# Patient Record
Sex: Female | Born: 1995 | Hispanic: Yes | Marital: Single | Smoking: Current some day smoker
Health system: Southern US, Community
[De-identification: ages and names within clinical notes are randomized; demographics above are authoritative.]

## PROBLEM LIST (undated history)

## (undated) ENCOUNTER — Inpatient Hospital Stay (HOSPITAL_COMMUNITY): Payer: Self-pay

## (undated) DIAGNOSIS — Z789 Other specified health status: Secondary | ICD-10-CM

## (undated) DIAGNOSIS — O24419 Gestational diabetes mellitus in pregnancy, unspecified control: Secondary | ICD-10-CM

## (undated) DIAGNOSIS — N83209 Unspecified ovarian cyst, unspecified side: Secondary | ICD-10-CM

## (undated) DIAGNOSIS — N390 Urinary tract infection, site not specified: Secondary | ICD-10-CM

## (undated) HISTORY — DX: Urinary tract infection, site not specified: N39.0

## (undated) HISTORY — DX: Gestational diabetes mellitus in pregnancy, unspecified control: O24.419

## (undated) HISTORY — PX: NO PAST SURGERIES: SHX2092

---
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
## 2016-07-21 ENCOUNTER — Ambulatory Visit (INDEPENDENT_AMBULATORY_CARE_PROVIDER_SITE_OTHER): Payer: BLUE CROSS/BLUE SHIELD | Admitting: Family Medicine

## 2016-07-21 ENCOUNTER — Encounter: Payer: Self-pay | Admitting: Family Medicine

## 2016-07-21 VITALS — BP 122/64 | HR 84 | Temp 98.3°F | Ht 60.0 in | Wt 120.0 lb

## 2016-07-21 DIAGNOSIS — D72829 Elevated white blood cell count, unspecified: Secondary | ICD-10-CM

## 2016-07-21 DIAGNOSIS — Z13220 Encounter for screening for lipoid disorders: Secondary | ICD-10-CM

## 2016-07-21 DIAGNOSIS — Z1329 Encounter for screening for other suspected endocrine disorder: Secondary | ICD-10-CM | POA: Diagnosis not present

## 2016-07-21 DIAGNOSIS — Z130 Encounter for screening for diseases of the blood and blood-forming organs and certain disorders involving the immune mechanism: Secondary | ICD-10-CM

## 2016-07-21 DIAGNOSIS — Z131 Encounter for screening for diabetes mellitus: Secondary | ICD-10-CM | POA: Diagnosis not present

## 2016-07-21 DIAGNOSIS — Z119 Encounter for screening for infectious and parasitic diseases, unspecified: Secondary | ICD-10-CM | POA: Diagnosis not present

## 2016-07-21 NOTE — Progress Notes (Signed)
[facility] [address], [address] [place], [place] [address] [phone]  Date:  07/21/2016   Name:  Dana Castro   DOB:  [birth year]   MRN:  [id number]  PCP:  Pearline Cables, MD    Chief Complaint: Establish Care (Pt here to est care. )   History of Present Illness:  Dana Castro is a 21 y.o. very pleasant female patient who presents with the following:  Here today as a new patient.    She is generally in good health She is working at [employer]. She is living at home, thinking about going back to school but she has not decided on what she might do.  Reviewed her old records- history of pyelo and ankle sprain No major health issues in the past In her free time she enjoys sleeping- she works 3rd shift, 6 days a week  She has not had a CPE in a while Never had pap- she is under 22  She would like to do a cholesterol check, etc today She is sexually active with a female partner- she does not need contraception at this time   She is a non smoker.  She does drink some alcohol but not to excess. Notes that she may occasionally feel anxious, but she is able to take a moment to center herself and will feel better  She had her tdap prior to 7th grade Discussed gardasil but she declines for now  Patient Active Problem List   Diagnosis Date Noted  . Pyelonephritis 08/16/2013    No past medical history on file.  No past surgical history on file.  Social History  Substance Use Topics  . Smoking status: Never Smoker  . Smokeless tobacco: Never Used  . Alcohol use Not on file    Family History  Problem Relation Age of Onset  . Diabetes Father     No Known Allergies  Medication list has been reviewed and updated.  No current outpatient prescriptions on file prior to visit.   No current facility-administered medications on file prior to visit.     Review of Systems:  As per HPI- otherwise negative. No  fever, chills, CP, SOB, nausea, vomiting, diarrhea    Physical Examination: Vitals:   07/21/16 1420  BP: 122/64  Pulse: 84  Temp: 98.3 F (36.8 C)   Vitals:   07/21/16 1420  Weight: 120 lb (54.4 kg)  Height: 5' (1.524 m)   Body mass index is 23.44 kg/m. Ideal Body Weight: Weight in (lb) to have BMI = 25: 127.7  GEN: WDWN, NAD, Non-toxic, A & O x 3, looks well, normal weight HEENT: Atraumatic, Normocephalic. Neck supple. No masses, No LAD.  Bilateral TM wnl, oropharynx normal.  PEERL,EOMI.   Ears and Nose: No external deformity. CV: RRR, No M/G/R. No JVD. No thrill. No extra heart sounds. PULM: CTA B, no wheezes, crackles, rhonchi. No retractions. No resp. distress. No accessory muscle use. ABD: S, NT, ND EXTR: No c/c/e NEURO Normal gait.  PSYCH: Normally interactive. Conversant. Not depressed or anxious appearing.  Calm demeanor.    Assessment and Plan: Screening for deficiency anemia - Plan: CBC  Screening for diabetes mellitus - Plan: Comprehensive metabolic panel  Screening for hyperlipidemia - Plan: Lipid panel  Screening for thyroid disorder - Plan: TSH  Screening examination for infectious disease - Plan: HIV antibody  Here today to establish care She declines HPV vaccine today but  may get this at her next visit Will get screening labs as above for her today and will be in touch with results   Signed Abbe AmsterdamJessica Missael Ferrari, MD

## 2016-07-21 NOTE — Patient Instructions (Signed)
It was a pleasure to see you today!  Take care and I will be in touch with your labs Let me know if you need anything in the meantime. Otherwise we can plan to visit for a physical and pap after you turn 22

## 2016-07-22 ENCOUNTER — Encounter: Payer: Self-pay | Admitting: Family Medicine

## 2016-07-22 LAB — CBC
HCT: 39.9 % (ref 36.0–46.0)
Hemoglobin: 13.2 g/dL (ref 12.0–15.0)
MCHC: 33.1 g/dL (ref 30.0–36.0)
MCV: 90 fl (ref 78.0–100.0)
Platelets: 327 10*3/uL (ref 150.0–400.0)
RBC: 4.43 Mil/uL (ref 3.87–5.11)
RDW: 13.3 % (ref 11.5–14.6)
WBC: 10.9 10*3/uL — ABNORMAL HIGH (ref 4.5–10.5)

## 2016-07-22 LAB — LIPID PANEL
Cholesterol: 140 mg/dL (ref 0–200)
HDL: 51.2 mg/dL (ref >39.00)
LDL Cholesterol: 60 mg/dL (ref 0–99)
NONHDL: 88.51
Total CHOL/HDL Ratio: 3
Triglycerides: 142 mg/dL (ref 0.0–149.0)
VLDL: 28.4 mg/dL (ref 0.0–40.0)

## 2016-07-22 LAB — COMPREHENSIVE METABOLIC PANEL
ALT: 11 U/L (ref 0–35)
AST: 23 U/L (ref 0–37)
Albumin: 4.1 g/dL (ref 3.5–5.2)
Alkaline Phosphatase: 71 U/L (ref 39–117)
BUN: 12 mg/dL (ref 6–23)
CHLORIDE: 103 meq/L (ref 96–112)
CO2: 26 mEq/L (ref 19–32)
Calcium: 9.4 mg/dL (ref 8.4–10.5)
Creatinine, Ser: 0.64 mg/dL (ref 0.40–1.20)
GFR: 125.68 mL/min (ref >60.00)
Glucose, Bld: 99 mg/dL (ref 70–99)
Potassium: 3.9 mEq/L (ref 3.5–5.1)
SODIUM: 137 meq/L (ref 135–145)
TOTAL PROTEIN: 7 g/dL (ref 6.0–8.3)
Total Bilirubin: 0.3 mg/dL (ref 0.2–1.2)

## 2016-07-22 LAB — HIV ANTIBODY (ROUTINE TESTING W REFLEX): HIV 1&2 Ab, 4th Generation: NONREACTIVE

## 2016-07-22 LAB — TSH: TSH: 1.75 u[IU]/mL (ref 0.35–5.50)

## 2016-07-22 NOTE — Addendum Note (Signed)
Addended by: Abbe AmsterdamOPLAND, Jamonta Goerner C on: 07/22/2016 01:29 PM   Modules accepted: Orders

## 2016-09-08 ENCOUNTER — Emergency Department (HOSPITAL_COMMUNITY)
Admission: EM | Admit: 2016-09-08 | Discharge: 2016-09-08 | Disposition: A | Discharge status: Home / Self Care | Payer: BLUE CROSS/BLUE SHIELD | Attending: Emergency Medicine | Admitting: Emergency Medicine

## 2016-09-08 ENCOUNTER — Emergency Department (HOSPITAL_COMMUNITY): Payer: BLUE CROSS/BLUE SHIELD

## 2016-09-08 ENCOUNTER — Encounter (HOSPITAL_COMMUNITY): Payer: Self-pay

## 2016-09-08 DIAGNOSIS — M25571 Pain in right ankle and joints of right foot: Secondary | ICD-10-CM | POA: Diagnosis not present

## 2016-09-08 DIAGNOSIS — W101XXA Fall (on)(from) sidewalk curb, initial encounter: Secondary | ICD-10-CM | POA: Insufficient documentation

## 2016-09-08 DIAGNOSIS — R600 Localized edema: Secondary | ICD-10-CM | POA: Insufficient documentation

## 2016-09-08 DIAGNOSIS — S99911A Unspecified injury of right ankle, initial encounter: Secondary | ICD-10-CM | POA: Diagnosis not present

## 2016-09-08 NOTE — ED Provider Notes (Signed)
[facility]-EMERGENCY DEPT Provider Note   CSN: [id number] Arrival date & time: 09/08/16  1739  By signing my name below, I, Thelma Barge, attest that this documentation has been prepared under the direction and in the presence of Charley Miske PA-C. Electronically Signed: Thelma Barge, Scribe. 09/08/16. 6:48 PM.  History   Chief Complaint Chief Complaint  Patient presents with  . Ankle Injury  The history is provided by the patient. No language interpreter was used.    HPI Comments: Dana Castro is a 21 y.o. female who presents to the Emergency Department complaining of constant, gradually worsening right-sided ankle pain s/p Mechanical fall that occurred last night. She states she fell off the side of a sidewalk and injured her ankle. She has been limping due to the pain and notes bearing weight makes the pain worse, but she is ambulatory. She has been applying ice and taking ibuprofen with mild relief. She denies LOC or head injury. Pt was previously healthy with no pertinent medical histories and has NKDA. No previous injury or procedure in this ankle.  Past Medical History:  Diagnosis Date  . Urinary tract infection     Patient Active Problem List   Diagnosis Date Noted  . Pyelonephritis 08/16/2013    Past Surgical History:  Procedure Laterality Date  . NO PAST SURGERIES      OB History    No data available       Home Medications    Prior to Admission medications   Not on File    Family History Family History  Problem Relation Age of Onset  . Diabetes Father   . Diabetes Paternal Grandmother   . Hypertension Paternal Grandmother   . Diabetes Other     Social History Social History  Substance Use Topics  . Smoking status: Never Smoker  . Smokeless tobacco: Never Used  . Alcohol use Not on file     Allergies   Patient has no known allergies.   Review of Systems Review of Systems  Eyes: Negative for visual disturbance.  Gastrointestinal: Negative for  nausea and vomiting.  Musculoskeletal: Positive for arthralgias and gait problem.  Skin: Negative for color change.  Neurological: Negative for syncope and headaches.     Physical Exam Updated Vital Signs BP 113/83 (BP Location: Left Arm)   Pulse 81   Temp 98.7 F (37.1 C) (Oral)   Resp 18   Ht 5' (1.524 m)   Wt 54.4 kg (120 lb)   LMP 09/07/2016 (Approximate)   SpO2 97%   BMI 23.44 kg/m   Physical Exam  Constitutional: She appears well-developed and well-nourished. No distress.  HENT:  Head: Normocephalic and atraumatic.  Eyes: Conjunctivae and EOM are normal. No scleral icterus.  Neck: Normal range of motion.  Pulmonary/Chest: Effort normal. No respiratory distress.  Musculoskeletal: She exhibits edema and tenderness. She exhibits no deformity.       Feet:  TTP of the lateral right ankle. There is pain with inversion of the ankle but otherwise full passive range of motion. There is mild edema present. There is no bruising, color or temperature change noted. Sensation intact to light touch. 2+ DP pulse.   Neurological: She is alert.  Skin: No rash noted. She is not diaphoretic.  Psychiatric: She has a normal mood and affect.  Nursing note and vitals reviewed.    ED Treatments / Results  DIAGNOSTIC STUDIES: Oxygen Saturation is 97% on RA, normal by my interpretation.    COORDINATION OF CARE:  6:47 PM Discussed treatment plan with pt at bedside and pt agreed to plan.  Labs (all labs ordered are listed, but only abnormal results are displayed) Labs Reviewed - No data to display  EKG  EKG Interpretation None       Radiology Dg Ankle Complete Right  Result Date: 09/08/2016 CLINICAL DATA:  Right ankle injury post fall. EXAM: RIGHT ANKLE - COMPLETE 3+ VIEW COMPARISON:  None. FINDINGS: There is no evidence of fracture, dislocation, or joint effusion. There is no evidence of arthropathy or other focal bone abnormality. Soft tissues are unremarkable. IMPRESSION:  Negative. Electronically Signed   By: Ted Mcalpineobrinka  Dimitrova M.D.   On: 09/08/2016 19:32    Procedures Procedures (including critical care time)  Medications Ordered in ED Medications - No data to display   Initial Impression / Assessment and Plan / ED Course  I have reviewed the triage vital signs and the nursing notes.  Pertinent labs & imaging results that were available during my care of the patient were reviewed by me and considered in my medical decision making (see chart for details).     Patient presents to ED for right ankle pain that occurred after mechanical fall yesterday. She states that she is ambulatory but has some pain with weightbearing causing her to limp. There is tenderness to palpation of the lateral ankle joint on the right side. There is mild edema present. No tenderness to palpation of the digits. There is no color, temperature change noted concerning for septic joint or other infectious process. She is afebrile with no history of fever. She has full active range of motion of the ankle. X-rays were obtained and were negative for fracture, dislocation or effusion. I informed patient that her symptoms could be due to a sprain. We'll give a ASO brace to wear and advised her to take anti-inflammatories or Tylenol as needed for pain. Patient appears stable for discharge at this time. Strict return precautions given.  Final Clinical Impressions(s) / ED Diagnoses   Final diagnoses:  Acute right ankle pain    New Prescriptions New Prescriptions   No medications on file   I personally performed the services described in this documentation, which was scribed in my presence. The recorded information has been reviewed and is accurate.    Dietrich PatesKhatri, Junette Bernat, PA-C 09/08/16 Ninfa [place]    Pricilla LovelessGoldston, Scott, MD 09/10/16 [phone]

## 2016-09-08 NOTE — ED Triage Notes (Signed)
Pt presents with c/o fall that occurred yesterday and right ankle injury. Pt reports she fell off the side of the sidewalk, ambulatory to triage.

## 2016-09-08 NOTE — Discharge Instructions (Signed)
Wear ankle brace as directed. Take ibuprofen or Tylenol as needed for pain. Follow-up with PCP for further evaluation of symptoms persist. Return to ED for worsening pain, additional injury, inability to walk, numbness or weakness.

## 2016-10-25 ENCOUNTER — Encounter: Payer: Self-pay | Admitting: Internal Medicine

## 2016-10-25 ENCOUNTER — Ambulatory Visit (INDEPENDENT_AMBULATORY_CARE_PROVIDER_SITE_OTHER): Payer: BLUE CROSS/BLUE SHIELD | Admitting: Internal Medicine

## 2016-10-25 ENCOUNTER — Other Ambulatory Visit (INDEPENDENT_AMBULATORY_CARE_PROVIDER_SITE_OTHER): Payer: BLUE CROSS/BLUE SHIELD

## 2016-10-25 VITALS — BP 122/74 | HR 65 | Temp 98.7°F | Resp 16 | Ht 60.0 in | Wt 122.8 lb

## 2016-10-25 DIAGNOSIS — R1032 Left lower quadrant pain: Secondary | ICD-10-CM | POA: Insufficient documentation

## 2016-10-25 LAB — URINALYSIS, ROUTINE W REFLEX MICROSCOPIC
BILIRUBIN URINE: NEGATIVE
HGB URINE DIPSTICK: NEGATIVE
KETONES UR: NEGATIVE
LEUKOCYTES UA: NEGATIVE
NITRITE: NEGATIVE
RBC / HPF: NONE SEEN (ref 0–?)
Specific Gravity, Urine: 1.01 (ref 1.000–1.030)
TOTAL PROTEIN, URINE-UPE24: NEGATIVE
URINE GLUCOSE: NEGATIVE
UROBILINOGEN UA: 0.2 (ref 0.0–1.0)
WBC, UA: NONE SEEN (ref 0–?)
pH: 7 (ref 5.0–8.0)

## 2016-10-25 LAB — COMPREHENSIVE METABOLIC PANEL
ALK PHOS: 66 U/L (ref 39–117)
ALT: 10 U/L (ref 0–35)
AST: 18 U/L (ref 0–37)
Albumin: 4.1 g/dL (ref 3.5–5.2)
BUN: 9 mg/dL (ref 6–23)
CO2: 31 meq/L (ref 19–32)
Calcium: 9.5 mg/dL (ref 8.4–10.5)
Chloride: 103 mEq/L (ref 96–112)
Creatinine, Ser: 0.64 mg/dL (ref 0.40–1.20)
GFR: 125.35 mL/min (ref >60.00)
GLUCOSE: 90 mg/dL (ref 70–99)
Potassium: 4 mEq/L (ref 3.5–5.1)
SODIUM: 137 meq/L (ref 135–145)
Total Bilirubin: 0.4 mg/dL (ref 0.2–1.2)
Total Protein: 7.7 g/dL (ref 6.0–8.3)

## 2016-10-25 LAB — CBC WITH DIFFERENTIAL/PLATELET
BASOS ABS: 0 10*3/uL (ref 0.0–0.1)
Basophils Relative: 0.6 % (ref 0.0–3.0)
EOS ABS: 0.1 10*3/uL (ref 0.0–0.7)
Eosinophils Relative: 1.7 % (ref 0.0–5.0)
HCT: 42.7 % (ref 36.0–46.0)
Hemoglobin: 14.1 g/dL (ref 12.0–15.0)
LYMPHS ABS: 2.3 10*3/uL (ref 0.7–4.0)
Lymphocytes Relative: 29 % (ref 12.0–46.0)
MCHC: 33 g/dL (ref 30.0–36.0)
MCV: 91.4 fl (ref 78.0–100.0)
MONO ABS: 0.7 10*3/uL (ref 0.1–1.0)
MONOS PCT: 9.2 % (ref 3.0–12.0)
NEUTROS PCT: 59.5 % (ref 43.0–77.0)
Neutro Abs: 4.6 10*3/uL (ref 1.4–7.7)
PLATELETS: 308 10*3/uL (ref 150.0–400.0)
RBC: 4.67 Mil/uL (ref 3.87–5.11)
RDW: 13.1 % (ref 11.5–14.6)
WBC: 7.8 10*3/uL (ref 4.5–10.5)

## 2016-10-25 NOTE — Assessment & Plan Note (Signed)
LLQ > RLQ pain on exam  History of left ovarian cyst, UTI/pyelonephritis Will check urine preg, UA, UCx, cbc, cmp Pelvic and transvaginal US ordered ASAP advil or tylenol as needed advised her to call if pain increases or changes Treatment depending on above

## 2016-10-25 NOTE — Patient Instructions (Signed)
Have blood work and urine tests today.   Test(s) ordered today. Your results will be released to MyChart (or called to you) after review, usually within 72hours after test completion. If any changes need to be made, you will be notified at that same time.  An ultrasound was ordered and someone will call you to schedule this.   Take advil or tylenol as needed.  Call if you pain increases.

## 2016-10-25 NOTE — Progress Notes (Signed)
    Subjective:    Patient ID: Dana Castro, female    DOB: Jun 04, 1995, 21 y.o.   MRN: [id number]  HPI She is here for an acute visit.   Abdominal pain:  It started about three years ago.  She has been hospitalized in the past.  Recently she has LLQ and RLQ pain, more on the left side.  This morning the pain was increased in the LLQ at an intensity of 7/10.  She has a history of a left ovarian cyst and the pain felt similar to that pain. The pain lasted 3-4 hours and she has had none since. It is sore when she changes positions since this morning - it is a pulling sensation.     She no longer sees a gyn.  Her periods are regular - last one was 2-3 weeks ago.  She is not sexually active.    Medications and allergies reviewed with patient and updated if appropriate.  Patient Active Problem List   Diagnosis Date Noted  . Pyelonephritis 08/16/2013    No current outpatient prescriptions on file prior to visit.   No current facility-administered medications on file prior to visit.     Past Medical History:  Diagnosis Date  . Urinary tract infection     Past Surgical History:  Procedure Laterality Date  . NO PAST SURGERIES      Social History   Social History  . Marital status: Single    Spouse name: N/A  . Number of children: N/A  . Years of education: N/A   Social History Main Topics  . Smoking status: Never Smoker  . Smokeless tobacco: Never Used  . Alcohol use None  . Drug use: Unknown  . Sexual activity: Not Asked   Other Topics Concern  . None   Social History Narrative  . None    Family History  Problem Relation Age of Onset  . Diabetes Father   . Diabetes Paternal Grandmother   . Hypertension Paternal Grandmother   . Diabetes Other     Review of Systems  Constitutional: Negative for chills and fever.  Gastrointestinal: Positive for abdominal pain and nausea (intermittent). Negative for blood in stool, constipation and diarrhea.  Genitourinary:  Positive for frequency (some days). Negative for dysuria, hematuria and vaginal discharge.       Objective:   Vitals:   10/25/16 1306  BP: 122/74  Pulse: 65  Resp: 16  Temp: 98.7 F (37.1 C)  SpO2: 98%   Filed Weights   10/25/16 1306  Weight: 122 lb 12.8 oz (55.7 kg)   Body mass index is 23.98 kg/m.  Wt Readings from Last 3 Encounters:  10/25/16 122 lb 12.8 oz (55.7 kg)  09/08/16 120 lb (54.4 kg)  07/21/16 120 lb (54.4 kg)     Physical Exam  Constitutional: She appears well-developed and well-nourished. No distress.  HENT:  Head: Normocephalic and atraumatic.  Abdominal: Soft. She exhibits no distension and no mass. There is tenderness (LLQ > RLQ). There is no rebound and no guarding.  Genitourinary:  Genitourinary Comments: No cva tenderness, no internal exam done  Skin: Skin is warm and dry. She is not diaphoretic. No erythema.          Assessment & Plan:   See Problem List for Assessment and Plan of chronic medical problems.

## 2016-10-26 ENCOUNTER — Ambulatory Visit: Payer: BLUE CROSS/BLUE SHIELD | Admitting: Family Medicine

## 2016-10-26 LAB — PREGNANCY, URINE: Preg Test, Ur: NEGATIVE

## 2016-10-27 LAB — URINE CULTURE: ORGANISM ID, BACTERIA: NO GROWTH

## 2016-11-03 ENCOUNTER — Ambulatory Visit
Admission: RE | Admit: 2016-11-03 | Discharge: 2016-11-03 | Disposition: A | Discharge status: Home / Self Care | Payer: BLUE CROSS/BLUE SHIELD | Source: Ambulatory Visit | Attending: Internal Medicine | Admitting: Internal Medicine

## 2016-11-03 DIAGNOSIS — R1032 Left lower quadrant pain: Secondary | ICD-10-CM

## 2017-06-13 ENCOUNTER — Encounter: Payer: Self-pay | Admitting: Internal Medicine

## 2017-06-13 ENCOUNTER — Ambulatory Visit (HOSPITAL_BASED_OUTPATIENT_CLINIC_OR_DEPARTMENT_OTHER)
Admission: RE | Admit: 2017-06-13 | Discharge: 2017-06-13 | Disposition: A | Discharge status: Home / Self Care | Payer: BLUE CROSS/BLUE SHIELD | Source: Ambulatory Visit | Attending: Internal Medicine | Admitting: Internal Medicine

## 2017-06-13 ENCOUNTER — Ambulatory Visit: Payer: BLUE CROSS/BLUE SHIELD | Admitting: Internal Medicine

## 2017-06-13 DIAGNOSIS — T1490XA Injury, unspecified, initial encounter: Secondary | ICD-10-CM | POA: Insufficient documentation

## 2017-06-13 DIAGNOSIS — S139XXA Sprain of joints and ligaments of unspecified parts of neck, initial encounter: Secondary | ICD-10-CM

## 2017-06-13 DIAGNOSIS — S60211A Contusion of right wrist, initial encounter: Secondary | ICD-10-CM | POA: Diagnosis not present

## 2017-06-13 DIAGNOSIS — M25531 Pain in right wrist: Secondary | ICD-10-CM | POA: Diagnosis not present

## 2017-06-13 DIAGNOSIS — S199XXA Unspecified injury of neck, initial encounter: Secondary | ICD-10-CM | POA: Diagnosis not present

## 2017-06-13 DIAGNOSIS — S6991XA Unspecified injury of right wrist, hand and finger(s), initial encounter: Secondary | ICD-10-CM | POA: Diagnosis not present

## 2017-06-13 DIAGNOSIS — M542 Cervicalgia: Secondary | ICD-10-CM | POA: Diagnosis not present

## 2017-06-13 MED ORDER — CYCLOBENZAPRINE HCL 10 MG PO TABS: 10.0000 mg | ORAL_TABLET | Freq: Every evening | ORAL | 0 refills | Status: DC | PRN

## 2017-06-13 NOTE — Progress Notes (Signed)
Pre visit review using our clinic review tool, if applicable. No additional management support is needed unless otherwise documented below in the visit note. 

## 2017-06-13 NOTE — Patient Instructions (Addendum)
Get your x-rays downstairs  Get a carpal tunnel syndrome splinter and use it in your right wrist for 10 days  IBUPROFEN (Advil or Motrin) 200 mg 2 tablets every 8 hours as needed for pain for the next 4-5 days.  Always take it with food because may cause gastritis and ulcers.  If you notice nausea, stomach pain, change in the color of stools --->  Stop the medicine and let us know   Flexeril, a muscle relaxant at night. Will cause drowsiness   Ice to your wrist twice a day Heating pad to the neck twice a day  Call if no gradually better in the next 2 weeks  Call anytime if you have severe headache.
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
## 2017-07-07 ENCOUNTER — Ambulatory Visit: Payer: BLUE CROSS/BLUE SHIELD | Admitting: Family

## 2017-07-07 ENCOUNTER — Encounter: Payer: Self-pay | Admitting: Family

## 2017-07-07 ENCOUNTER — Other Ambulatory Visit (HOSPITAL_COMMUNITY)
Admission: RE | Admit: 2017-07-07 | Discharge: 2017-07-07 | Disposition: A | Discharge status: Home / Self Care | Payer: BLUE CROSS/BLUE SHIELD | Source: Ambulatory Visit | Attending: Family | Admitting: Family

## 2017-07-07 ENCOUNTER — Encounter: Payer: Self-pay | Admitting: Family Medicine

## 2017-07-07 VITALS — BP 115/64 | HR 92 | Temp 98.1°F | Resp 16 | Ht 60.0 in | Wt 115.8 lb

## 2017-07-07 DIAGNOSIS — Z202 Contact with and (suspected) exposure to infections with a predominantly sexual mode of transmission: Secondary | ICD-10-CM | POA: Diagnosis not present

## 2017-07-07 DIAGNOSIS — J069 Acute upper respiratory infection, unspecified: Secondary | ICD-10-CM

## 2017-07-07 DIAGNOSIS — N760 Acute vaginitis: Secondary | ICD-10-CM | POA: Diagnosis not present

## 2017-07-07 DIAGNOSIS — B9689 Other specified bacterial agents as the cause of diseases classified elsewhere: Secondary | ICD-10-CM | POA: Insufficient documentation

## 2017-07-07 NOTE — Patient Instructions (Addendum)
Please complete lab work prior to leaving.  You should repeat hiv screening in 3 months.
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
## 2017-07-10 LAB — RPR: RPR: NONREACTIVE

## 2017-07-10 LAB — HEPATITIS B SURFACE ANTIGEN: HEP B S AG: NONREACTIVE

## 2017-07-10 LAB — HIV ANTIBODY (ROUTINE TESTING W REFLEX): HIV: NONREACTIVE

## 2017-07-11 LAB — CYTOLOGY - PAP
BACTERIAL VAGINITIS: POSITIVE — AB
Chlamydia: NEGATIVE
DIAGNOSIS: NEGATIVE
NEISSERIA GONORRHEA: NEGATIVE
TRICH (WINDOWPATH): NEGATIVE

## 2017-07-12 ENCOUNTER — Telehealth: Payer: Self-pay | Admitting: Family

## 2017-07-12 DIAGNOSIS — Z202 Contact with and (suspected) exposure to infections with a predominantly sexual mode of transmission: Secondary | ICD-10-CM

## 2017-07-12 MED ORDER — METRONIDAZOLE 500 MG PO TABS: 500.0000 mg | ORAL_TABLET | Freq: Two times a day (BID) | ORAL | 0 refills | Status: DC

## 2017-07-12 NOTE — Telephone Encounter (Signed)
STD testing negative.  + bacterial vaginosis is noted. Rx sent for metronidazole, needs to avoid alcohol while on metronidazole. She should plan to repeat HIV test in 3 months.

## 2017-07-12 NOTE — Telephone Encounter (Signed)
Notified pt and she voices understanding. Lab appt scheduled for 10/12/17 at 10am and future lab order has been entered.

## 2017-07-23 NOTE — Progress Notes (Signed)
[facility] [address], [address] [place], [place] [address] [phone]  Date:  07/26/2017   Name:  Dana Castro   DOB:  02-14-1996   MRN:  [id number]  PCP:  Pearline Cables, MD    Chief Complaint: Annual Exam (no concerns)   History of Present Illness:  Dana Castro is a 22 y.o. very pleasant female patient who presents with the following:  Here today for a CPE Last seen here by Efraim Kaufmann earlier this month for STD testing- they went ahead and did a pap at that time which is appropriate and appreciated  I last saw her about one year ago:  Here today as a new patient.   She is generally in good health She is working at [employer]. She is living at home, thinking about going back to school but she has not decided on what she might do. Reviewed her old records- history of pyelo and ankle sprain No major health issues in the past In her free time she enjoys sleeping- she works 3rd shift, 6 days a week She has not had a CPE in a while Never had pap- she is under 22 She would like to do a cholesterol check, etc today She is sexually active with a female partner- she does not need contraception at this time  She is a non smoker.  She does drink some alcohol but not to excess. Notes that she may occasionally feel anxious, but she is able to take a moment to center herself and will feel better She had her tdap prior to 7th grade Discussed gardasil but she declines for now  Her vaginal sx are resolved She is still working nights at [employer] ex, she just ended her shift right now  She took up smoking again- she had quit before. She is smoking MJ a couple of times a week- no tobacco She drinks a little alcohol but not a lot  She notes that she will sometimes have some pelvic cramping esp around her menses She was dx with ovarian cysts a couple of year ago- she notes that this has been evaluated a couple of times, most recently with  [place] about a year ago  Her LMP wast last week She did have sex with a female about a month ago- she is interested in getting on contraception in hopes that it would decrease her menstrual cramps and other symptoms Discussed option with her, and she would like to try the ortho evra patch  No history of stroke, clot or cancer, HTN She does not get aura when she gets HA   Patient Active Problem List   Diagnosis Date Noted  . LLQ pain 10/25/2016  . Pyelonephritis 08/16/2013    Past Medical History:  Diagnosis Date  . Urinary tract infection     Past Surgical History:  Procedure Laterality Date  . NO PAST SURGERIES      Social History   Tobacco Use  . Smoking status: Current Some Day Smoker  . Smokeless tobacco: Never Used  Substance Use Topics  . Alcohol use: Yes    Comment: occasionally  . Drug use: Yes    Types: Marijuana    Family History  Problem Relation Age of Onset  . Diabetes Father   . Diabetes Paternal Grandmother   . Hypertension Paternal Grandmother   . Diabetes Other     No Known Allergies  Medication list has been  reviewed and updated.  No current outpatient medications on file prior to visit.   No current facility-administered medications on file prior to visit.     Review of Systems:  As per HPI- otherwise negative. No CP or SOB No diarrhea, no vomiting   Physical Examination: Vitals:   07/26/17 1036  BP: 108/62  Pulse: (!) 101  Resp: 16  Temp: 98.1 F (36.7 C)  SpO2: 98%   Vitals:   07/26/17 1036  Weight: 118 lb (53.5 kg)  Height: 5' (1.524 m)   Body mass index is 23.05 kg/m. Ideal Body Weight: Weight in (lb) to have BMI = 25: 127.7  GEN: WDWN, NAD, Non-toxic, A & O x 3, normal weight, looks well  HEENT: Atraumatic, Normocephalic. Neck supple. No masses, No LAD.  Bilateral TM wnl, oropharynx normal.  PEERL,EOMI.   Ears and Nose: No external deformity. CV: RRR, No M/G/R. No JVD. No thrill. No extra heart sounds. PULM: CTA B,  no wheezes, crackles, rhonchi. No retractions. No resp. distress. No accessory muscle use. ABD: S, NT, ND, +BS. No rebound. No HSM.  Belly is benign at this time  EXTR: No c/c/e NEURO Normal gait.  PSYCH: Normally interactive. Conversant. Not depressed or anxious appearing.  Calm demeanor.   Results for orders placed or performed in visit on 07/26/17  POCT urine pregnancy  Result Value Ref Range   Preg Test, Ur Negative Negative    Assessment and Plan: Physical exam  Irregular menses - Plan: POCT urine pregnancy, norelgestromin-ethinyl estradiol (ORTHO EVRA) 150-35 MCG/24HR transdermal patch  Immunization due - Plan: Td vaccine greater than or equal to 7yo preservative free IM  CPE today' Due tetanus booster immunizations otherwise UTD, she already had gardasil  Negative HCG today Will have her start on ortho evra patch - she will let me know if her pelvic pain continues to be a concern   Signed Abbe Amsterdam, MD

## 2017-07-26 ENCOUNTER — Ambulatory Visit (INDEPENDENT_AMBULATORY_CARE_PROVIDER_SITE_OTHER): Payer: BLUE CROSS/BLUE SHIELD | Admitting: Family Medicine

## 2017-07-26 ENCOUNTER — Encounter: Payer: Self-pay | Admitting: Family Medicine

## 2017-07-26 VITALS — BP 108/62 | HR 101 | Temp 98.1°F | Resp 16 | Ht 60.0 in | Wt 118.0 lb

## 2017-07-26 DIAGNOSIS — Z0000 Encounter for general adult medical examination without abnormal findings: Secondary | ICD-10-CM

## 2017-07-26 DIAGNOSIS — N926 Irregular menstruation, unspecified: Secondary | ICD-10-CM | POA: Diagnosis not present

## 2017-07-26 DIAGNOSIS — Z23 Encounter for immunization: Secondary | ICD-10-CM

## 2017-07-26 LAB — POCT URINE PREGNANCY: Preg Test, Ur: NEGATIVE

## 2017-07-26 MED ORDER — NORELGESTROMIN-ETH ESTRADIOL 150-35 MCG/24HR TD PTWK: 1.0000 | MEDICATED_PATCH | TRANSDERMAL | 12 refills | Status: DC

## 2017-07-26 NOTE — Patient Instructions (Addendum)
I would encourage you to quit smoking!  Otherwise it was great to see you today Start on your birth control patch- you can begin today or another day if easier for you to remember (sat, sun) Change the patch weekly for 3 weeks, then leave off for one week (during which time you will have your menses) If you prefer to use the patch continuously (wear every week without a break) that is fine, but I would take a week off every 3 months or so to avoid unscheduled bleeding  Please let me know if this helps with your pelvic pain

## 2017-10-12 ENCOUNTER — Other Ambulatory Visit: Payer: BLUE CROSS/BLUE SHIELD

## 2017-11-21 ENCOUNTER — Encounter (HOSPITAL_COMMUNITY): Payer: Self-pay | Admitting: Emergency Medicine

## 2017-11-21 ENCOUNTER — Emergency Department (HOSPITAL_COMMUNITY)
Admission: EM | Admit: 2017-11-21 | Discharge: 2017-11-22 | Discharge status: Against Medical Advice | Payer: BLUE CROSS/BLUE SHIELD | Attending: Emergency Medicine | Admitting: Emergency Medicine

## 2017-11-21 ENCOUNTER — Other Ambulatory Visit: Payer: Self-pay

## 2017-11-21 DIAGNOSIS — R109 Unspecified abdominal pain: Secondary | ICD-10-CM | POA: Insufficient documentation

## 2017-11-21 DIAGNOSIS — Z5321 Procedure and treatment not carried out due to patient leaving prior to being seen by health care provider: Secondary | ICD-10-CM | POA: Diagnosis not present

## 2017-11-21 LAB — URINALYSIS, ROUTINE W REFLEX MICROSCOPIC
Bilirubin Urine: NEGATIVE
GLUCOSE, UA: NEGATIVE mg/dL
Hgb urine dipstick: NEGATIVE
KETONES UR: 20 mg/dL — AB
Nitrite: NEGATIVE
PROTEIN: NEGATIVE mg/dL
Specific Gravity, Urine: 1.017 (ref 1.005–1.030)
pH: 8 (ref 5.0–8.0)

## 2017-11-21 LAB — CBC
HEMATOCRIT: 42.2 % (ref 36.0–46.0)
HEMOGLOBIN: 13.8 g/dL (ref 12.0–15.0)
MCH: 30.3 pg (ref 26.0–34.0)
MCHC: 32.7 g/dL (ref 30.0–36.0)
MCV: 92.5 fL (ref 78.0–100.0)
Platelets: 324 10*3/uL (ref 150–400)
RBC: 4.56 MIL/uL (ref 3.87–5.11)
RDW: 12.5 % (ref 11.5–15.5)
WBC: 13.5 10*3/uL — ABNORMAL HIGH (ref 4.0–10.5)

## 2017-11-21 LAB — COMPREHENSIVE METABOLIC PANEL
ALBUMIN: 4 g/dL (ref 3.5–5.0)
ALT: 14 U/L (ref 0–44)
ANION GAP: 11 (ref 5–15)
AST: 24 U/L (ref 15–41)
Alkaline Phosphatase: 73 U/L (ref 38–126)
BILIRUBIN TOTAL: 0.5 mg/dL (ref 0.3–1.2)
BUN: 9 mg/dL (ref 6–20)
CHLORIDE: 103 mmol/L (ref 98–111)
CO2: 22 mmol/L (ref 22–32)
Calcium: 9 mg/dL (ref 8.9–10.3)
Creatinine, Ser: 0.63 mg/dL (ref 0.44–1.00)
GFR calc Af Amer: >60 mL/min (ref >60)
GFR calc non Af Amer: >60 mL/min (ref >60)
GLUCOSE: 121 mg/dL — AB (ref 70–99)
POTASSIUM: 4 mmol/L (ref 3.5–5.1)
SODIUM: 136 mmol/L (ref 135–145)
Total Protein: 7.2 g/dL (ref 6.5–8.1)

## 2017-11-21 LAB — I-STAT BETA HCG BLOOD, ED (MC, WL, AP ONLY)

## 2017-11-21 LAB — LIPASE, BLOOD: LIPASE: 25 U/L (ref 11–51)

## 2017-11-21 NOTE — ED Triage Notes (Signed)
Pt c/o lower abdominal pain and nausea that started this morning. Also c/o urinary frequency, hx UTI.

## 2017-11-23 ENCOUNTER — Encounter: Payer: Self-pay | Admitting: Family Medicine

## 2017-11-23 ENCOUNTER — Ambulatory Visit: Payer: BLUE CROSS/BLUE SHIELD | Admitting: Family Medicine

## 2017-11-23 VITALS — BP 107/60 | HR 90 | Temp 98.5°F | Resp 16 | Ht 60.0 in | Wt 113.2 lb

## 2017-11-23 DIAGNOSIS — Z23 Encounter for immunization: Secondary | ICD-10-CM

## 2017-11-23 DIAGNOSIS — R7989 Other specified abnormal findings of blood chemistry: Secondary | ICD-10-CM | POA: Diagnosis not present

## 2017-11-23 DIAGNOSIS — N926 Irregular menstruation, unspecified: Secondary | ICD-10-CM

## 2017-11-23 DIAGNOSIS — R300 Dysuria: Secondary | ICD-10-CM | POA: Diagnosis not present

## 2017-11-23 LAB — POC URINALSYSI DIPSTICK (AUTOMATED)
BILIRUBIN UA: NEGATIVE
Blood, UA: NEGATIVE
Glucose, UA: NEGATIVE
KETONES UA: NEGATIVE
LEUKOCYTES UA: NEGATIVE
Nitrite, UA: NEGATIVE
PH UA: 7.5 (ref 5.0–8.0)
Protein, UA: NEGATIVE
Spec Grav, UA: 1.01 (ref 1.010–1.025)
Urobilinogen, UA: 0.2 E.U./dL

## 2017-11-23 MED ORDER — NORELGESTROMIN-ETH ESTRADIOL 150-35 MCG/24HR TD PTWK: 1.0000 | MEDICATED_PATCH | TRANSDERMAL | 12 refills | Status: DC

## 2017-11-23 MED ORDER — CIPROFLOXACIN HCL 250 MG PO TABS: 250.0000 mg | ORAL_TABLET | Freq: Two times a day (BID) | ORAL | 0 refills | Status: DC

## 2017-11-23 NOTE — Progress Notes (Signed)
Patient ID: Dana Castro, female    DOB: [birth year]  Age: 22 y.o. MRN: [id number]    Subjective:  Subjective  HPI Dana Castro presents for urinary frequency and dysuria x few days.  She was in the er yesterday but left without being seen.  Labs were done --- no culture.    Review of Systems  Constitutional: Negative for chills and fever.  HENT: Negative for congestion and hearing loss.   Eyes: Negative for discharge.  Respiratory: Negative for cough and shortness of breath.   Cardiovascular: Negative for chest pain, palpitations and leg swelling.  Gastrointestinal: Negative for abdominal pain, blood in stool, constipation, diarrhea, nausea and vomiting.  Genitourinary: Positive for dysuria, flank pain and frequency. Negative for hematuria and urgency.  Musculoskeletal: Negative for back pain and myalgias.  Skin: Negative for rash.  Allergic/Immunologic: Negative for environmental allergies.  Neurological: Negative for dizziness, weakness and headaches.  Hematological: Does not bruise/bleed easily.  Psychiatric/Behavioral: Negative for suicidal ideas. The patient is not nervous/anxious.     History Past Medical History:  Diagnosis Date  . Urinary tract infection     She has a past surgical history that includes No past surgeries.   Her family history includes Diabetes in her father, other, and paternal grandmother; Hypertension in her paternal grandmother.She reports that she has been smoking. She has never used smokeless tobacco. She reports that she drinks alcohol. She reports that she has current or past drug history. Drug: Marijuana.  No current outpatient medications on file prior to visit.   No current facility-administered medications on file prior to visit.      Objective:  Objective  Physical Exam  Constitutional: She is oriented to person, place, and time. She appears well-developed and well-nourished.  HENT:  Head: Normocephalic and atraumatic.  Eyes:  Conjunctivae and EOM are normal.  Neck: Normal range of motion. Neck supple. No JVD present. Carotid bruit is not present. No thyromegaly present.  Cardiovascular: Normal rate, regular rhythm and normal heart sounds.  No murmur heard. Pulmonary/Chest: Effort normal and breath sounds normal. No respiratory distress. She has no wheezes. She has no rales. She exhibits no tenderness.  Abdominal: She exhibits no mass. There is no tenderness. There is no rebound and no guarding.  Musculoskeletal: She exhibits no edema.  Neurological: She is alert and oriented to person, place, and time.  Psychiatric: She has a normal mood and affect.  Nursing note and vitals reviewed.  BP 107/60 (BP Location: Right Arm, Patient Position: Sitting, Cuff Size: Small)   Pulse 90   Temp 98.5 F (36.9 C) (Oral)   Resp 16   Ht 5' (1.524 m)   Wt 113 lb 3.2 oz (51.3 kg)   LMP 11/10/2017   SpO2 98%   BMI 22.11 kg/m  Wt Readings from Last 3 Encounters:  11/23/17 113 lb 3.2 oz (51.3 kg)  07/26/17 118 lb (53.5 kg)  07/07/17 115 lb 12.8 oz (52.5 kg)     Lab Results  Component Value Date   WBC 13.5 (H) 11/21/2017   HGB 13.8 11/21/2017   HCT 42.2 11/21/2017   PLT 324 11/21/2017   GLUCOSE 121 (H) 11/21/2017   CHOL 140 07/21/2016   TRIG 142.0 07/21/2016   HDL 51.20 07/21/2016   LDLCALC 60 07/21/2016   ALT 14 11/21/2017   AST 24 11/21/2017   NA 136 11/21/2017   K 4.0 11/21/2017   CL 103 11/21/2017   CREATININE 0.63 11/21/2017   BUN 9 11/21/2017  CO2 22 11/21/2017   TSH 1.75 07/21/2016    No results found.   Assessment & Plan:  Plan  I am having Dana Castro start on ciprofloxacin. I am also having her maintain her norelgestromin-ethinyl estradiol.  Meds ordered this encounter  Medications  . ciprofloxacin (CIPRO) 250 MG tablet    Sig: Take 1 tablet (250 mg total) by mouth 2 (two) times daily.    Dispense:  6 tablet    Refill:  0  . norelgestromin-ethinyl estradiol (ORTHO EVRA) 150-35  MCG/24HR transdermal patch    Sig: Place 1 patch onto the skin once a week.    Dispense:  3 patch    Refill:  12    Problem List Items Addressed This Visit    None    Visit Diagnoses    Dysuria    -  Primary   Relevant Medications   ciprofloxacin (CIPRO) 250 MG tablet   Other Relevant Orders   POCT Urinalysis Dipstick (Automated) (Completed)   Urine Culture   Irregular menses       Relevant Medications   norelgestromin-ethinyl estradiol (ORTHO EVRA) 150-35 MCG/24HR transdermal patch   Abnormal CBC       Relevant Orders   CBC with Differential/Platelet   Needs flu shot       Relevant Orders   Flu Vaccine QUAD 36+ mos IM (Fluarix & Fluzone Quad PF (Completed)      Follow-up: Return if symptoms worsen or fail to improve.  Donato SchultzYvonne R Lowne Chase, DO

## 2017-11-23 NOTE — Patient Instructions (Signed)

## 2017-12-10 NOTE — Progress Notes (Signed)
[facility] [address], [address] [place], [place] [address] [phone]  Date:  12/13/2017   Name:  Dana Castro   DOB:  Feb 04, 1996   MRN:  [id number]  PCP:  Pearline Cables, MD    Chief Complaint: Dysuria (2-3 week follow up, feeling much better)   History of Present Illness:  Dana Castro is a 22 y.o. very pleasant female patient who presents with the following:  Seen on 9/19 by Dr. Laury Axon with dysuria- she had been to the ER 2 days prior and had a UA but left prior to treatment due to long wait  She was treated with cipro. It looks like a urine culture was ordered but not collected  Her sx are now resolved and she feels well  No dysuria or blood in urine  No fever No belly pain No chance of pregnancy   She is using ortho evra patch for contraception  She has been applying it to her leg as she sweats at work and found this to be a good location.  advised her that I would encourage her to apply it to an approved location as per her package insert as this is how the drug was tested.   Also encouraged condom back up    Patient Active Problem List   Diagnosis Date Noted  . LLQ pain 10/25/2016  . Pyelonephritis 08/16/2013    Past Medical History:  Diagnosis Date  . Urinary tract infection     Past Surgical History:  Procedure Laterality Date  . NO PAST SURGERIES      Social History   Tobacco Use  . Smoking status: Current Some Day Smoker  . Smokeless tobacco: Never Used  Substance Use Topics  . Alcohol use: Yes    Comment: occasionally  . Drug use: Yes    Types: Marijuana    Family History  Problem Relation Age of Onset  . Diabetes Father   . Diabetes Paternal Grandmother   . Hypertension Paternal Grandmother   . Diabetes Other     No Known Allergies  Medication list has been reviewed and updated.  Current Outpatient Medications on File Prior to Visit  Medication Sig Dispense Refill  .  norelgestromin-ethinyl estradiol (ORTHO EVRA) 150-35 MCG/24HR transdermal patch Place 1 patch onto the skin once a week. 3 patch 12   No current facility-administered medications on file prior to visit.     Review of Systems:  As per HPI- otherwise negative.    Physical Examination: Vitals:   12/13/17 0952  BP: 112/78  Pulse: 75  Resp: 16  Temp: 98.4 F (36.9 C)  SpO2: 98%   Vitals:   12/13/17 0952  Weight: 114 lb (51.7 kg)  Height: 5' (1.524 m)   Body mass index is 22.26 kg/m. Ideal Body Weight: Weight in (lb) to have BMI = 25: 127.7  GEN: WDWN, NAD, Non-toxic, A & O x 3 HEENT: Atraumatic, Normocephalic. Neck supple. No masses, No LAD. Ears and Nose: No external deformity. CV: RRR, No M/G/R. No JVD. No thrill. No extra heart sounds. PULM: CTA B, no wheezes, crackles, rhonchi. No retractions. No resp. distress. No accessory muscle use. ABD: S, NT, ND, +BS. No rebound. No HSM. EXTR: No c/c/e NEURO Normal gait.  PSYCH: Normally interactive. Conversant. Not depressed or anxious appearing.  Calm demeanor.  Benign belly, n oCVA tenderness   Assessment and Plan: Encounter for surveillance of transdermal patch  hormonal contraceptive device  Acute cystitis without hematuria  Discussed how to use her patch UTI is now resolved, no Sx UA here on 9/19 did not show blood She will report if her sx return    Signed Abbe Amsterdam, MD

## 2017-12-13 ENCOUNTER — Ambulatory Visit (INDEPENDENT_AMBULATORY_CARE_PROVIDER_SITE_OTHER): Payer: BLUE CROSS/BLUE SHIELD | Admitting: Family Medicine

## 2017-12-13 ENCOUNTER — Encounter: Payer: Self-pay | Admitting: Family Medicine

## 2017-12-13 VITALS — BP 112/78 | HR 75 | Temp 98.4°F | Resp 16 | Ht 60.0 in | Wt 114.0 lb

## 2017-12-13 DIAGNOSIS — N3000 Acute cystitis without hematuria: Secondary | ICD-10-CM

## 2017-12-13 DIAGNOSIS — Z3045 Encounter for surveillance of transdermal patch hormonal contraceptive device: Secondary | ICD-10-CM

## 2017-12-13 NOTE — Patient Instructions (Signed)
Good to see you today!  Take care

## 2018-03-13 NOTE — Progress Notes (Signed)
[facility] [address] [place], [place] [address] [phone]  Date:  03/14/2018   Name:  Dana Castro   DOB:  November 19, 1995   MRN:  [id number]  PCP:  Pearline Cables, MD    Chief Complaint: URI   History of Present Illness:  Dana Castro is a 23 y.o. very pleasant female patient who presents with the following:  Generally healthy young woman here today with concern of acute illness She notes onset of illness about 6 days ago She does feel better since the start, but has noted a cough, sinus congestion, chest congestion She did have a temp a couple of nights ago to 100.? She has noted body aches, she had to miss work for a couple of days No vomiting but she did get close to post- tussive emesis No diarrhea  Her dad is sick, and her mom was also sick   No chance of pregnancy   Patient Active Problem List   Diagnosis Date Noted  . LLQ pain 10/25/2016  . Pyelonephritis 08/16/2013    Past Medical History:  Diagnosis Date  . Urinary tract infection     Past Surgical History:  Procedure Laterality Date  . NO PAST SURGERIES      Social History   Tobacco Use  . Smoking status: Current Some Day Smoker  . Smokeless tobacco: Never Used  Substance Use Topics  . Alcohol use: Yes    Comment: occasionally  . Drug use: Yes    Types: Marijuana    Family History  Problem Relation Age of Onset  . Diabetes Father   . Diabetes Paternal Grandmother   . Hypertension Paternal Grandmother   . Diabetes Other     No Known Allergies  Medication list has been reviewed and updated.  Current Outpatient Medications on File Prior to Visit  Medication Sig Dispense Refill  . norelgestromin-ethinyl estradiol (ORTHO EVRA) 150-35 MCG/24HR transdermal patch Place 1 patch onto the skin once a week. 3 patch 12   No current facility-administered medications on file prior to visit.     Review of Systems:  As per  HPI- otherwise negative. No rash, no vomiting  Physical Examination: Vitals:   03/14/18 1344  BP: 112/70  Pulse: 92  Resp: 16  Temp: 98.6 F (37 C)  SpO2: 96%   Vitals:   03/14/18 1344  Weight: 117 lb (53.1 kg)  Height: 5' (1.524 m)   Body mass index is 22.85 kg/m. Ideal Body Weight: Weight in (lb) to have BMI = 25: 127.7  GEN: WDWN, NAD, Non-toxic, A & O x 3, appears to be mildly ill HEENT: Atraumatic, Normocephalic. Neck supple. No masses, No LAD.  Bilateral TM wnl, oropharynx normal.  PEERL,EOMI. no meningismus Ears and Nose: No external deformity. CV: RRR, No M/G/R. No JVD. No thrill. No extra heart sounds. PULM: CTA B, no wheezes, crackles, rhonchi. No retractions. No resp. distress. No accessory muscle use. ABD: S, NT, ND. No rebound. No HSM. EXTR: No c/c/e NEURO Normal gait.  PSYCH: Normally interactive. Conversant. Not depressed or anxious appearing.  Calm demeanor.   Results for orders placed or performed in visit on 03/14/18  POC Influenza A&B (Binax test)  Result Value Ref Range   Influenza A, POC Positive (A) Negative   Influenza B, POC Negative Negative  POCT rapid strep A  Result Value Ref Range   Rapid Strep A Screen Negative Negative  Assessment and Plan: Sore throat - Plan: POCT rapid strep A  Body aches - Plan: POC Influenza A&B (Binax test)  Influenza A  Diagnosed with flu a today.  She did have her flu shot Symptoms started about 6 days ago, she is feeling better the last couple of days but still not great. Discussed Tamiflu, it will offer very limited benefit at this point in her illness.  However I am glad to write it for her.  At this point she declines Tamiflu. Given note for work, encouraged rest, fluids, OTC medications as needed She will let me know if not feeling better the next few days  Signed Abbe AmsterdamJessica Kohl Polinsky, MD

## 2018-03-14 ENCOUNTER — Encounter: Payer: Self-pay | Admitting: Family Medicine

## 2018-03-14 ENCOUNTER — Ambulatory Visit: Payer: BLUE CROSS/BLUE SHIELD | Admitting: Family Medicine

## 2018-03-14 ENCOUNTER — Encounter

## 2018-03-14 VITALS — BP 112/70 | HR 92 | Temp 98.6°F | Resp 16 | Ht 60.0 in | Wt 117.0 lb

## 2018-03-14 DIAGNOSIS — J101 Influenza due to other identified influenza virus with other respiratory manifestations: Secondary | ICD-10-CM

## 2018-03-14 DIAGNOSIS — J029 Acute pharyngitis, unspecified: Secondary | ICD-10-CM

## 2018-03-14 DIAGNOSIS — R52 Pain, unspecified: Secondary | ICD-10-CM | POA: Diagnosis not present

## 2018-03-14 LAB — POCT RAPID STREP A (OFFICE): Rapid Strep A Screen: NEGATIVE

## 2018-03-14 LAB — POC INFLUENZA A&B (BINAX/QUICKVUE)
INFLUENZA A, POC: POSITIVE — AB
INFLUENZA B, POC: NEGATIVE

## 2018-03-14 NOTE — Patient Instructions (Signed)
It was good to see you today, but I am sorry that you have the flu.  At this point in the illness there is not much to be gained by using Tamiflu.  I recommend rest, fluids, and over-the-counter medications for symptoms as needed.  If you are not feeling better the next few days please do let me know, sooner if you are getting worse

## 2018-07-18 ENCOUNTER — Encounter: Payer: Self-pay | Admitting: Family Medicine

## 2018-07-18 ENCOUNTER — Ambulatory Visit (INDEPENDENT_AMBULATORY_CARE_PROVIDER_SITE_OTHER): Payer: BLUE CROSS/BLUE SHIELD | Admitting: Family Medicine

## 2018-07-18 DIAGNOSIS — M62838 Other muscle spasm: Secondary | ICD-10-CM | POA: Diagnosis not present

## 2018-07-18 MED ORDER — CYCLOBENZAPRINE HCL 10 MG PO TABS: 5.0000 mg | ORAL_TABLET | Freq: Two times a day (BID) | ORAL | 0 refills | Status: DC | PRN

## 2018-07-18 NOTE — Progress Notes (Signed)
Dana Castro [address] [place], [place] [address] [phone]  Date:  07/18/2018   Name:  Dana Castro   DOB:  [birth year]   MRN:  [id number]  PCP:  Dana Cables, MD    Chief Complaint: No chief complaint on file.   History of Present Illness:  Dana Castro is a 23 y.o. very pleasant female patient who presents with the following:  Virtual visit today due to pandemic Patient location is home Provider location is office Pt ID confirmed with name and DOB She gives consent for virtual visit today  Generally healthy young woman, we did a physical about 1 year ago I saw her in January for sick visit with sore throat-she had flu A at that time   Today she notes that she awoke with a crick in her neck yesterday am As the day went on it seemed to get worse Today it seems to be a bit better, but it is still uncomfortable.  She notes pain in the left sided neck muscles, running down into the trapezius.  It is tender if she presses on this area No falls or other injury No cough or fever No headache No flulike symptoms  She has not had this in the past   So far she tried some OTC medication- advil and tylenol  She works for [employer]-she actually works at night   Patient Active Problem List   Diagnosis Date Noted  . LLQ pain 10/25/2016  . Pyelonephritis 08/16/2013    Past Medical History:  Diagnosis Date  . Urinary tract infection     Past Surgical History:  Procedure Laterality Date  . NO PAST SURGERIES      Social History   Tobacco Use  . Smoking status: Current Some Day Smoker  . Smokeless tobacco: Never Used  Substance Use Topics  . Alcohol use: Yes    Comment: occasionally  . Drug use: Yes    Types: Marijuana    Family History  Problem Relation Age of Onset  . Diabetes Father   . Diabetes Paternal Grandmother   . Hypertension Paternal Grandmother   . Diabetes Other     No  Known Allergies  Medication list has been reviewed and updated.  Current Outpatient Medications on File Prior to Visit  Medication Sig Dispense Refill  . norelgestromin-ethinyl estradiol (ORTHO EVRA) 150-35 MCG/24HR transdermal patch Place 1 patch onto the skin once a week. 3 patch 12   No current facility-administered medications on file prior to visit.     Review of Systems:  As per HPI- otherwise negative.   Physical Examination: There were no vitals filed for this visit. There were no vitals filed for this visit. There is no height or weight on file to calculate BMI. Ideal Body Weight:    Patient observed over video today.  She looks well, her normal self.  No cough, wheezing, distress is noted.  She has full extension of her neck, flexion also appears normal.  She has pain with lateral rotation  Assessment and Plan: Neck muscle spasm - Plan: cyclobenzaprine (FLEXERIL) 10 MG tablet  Virtual visit today for concern of neck spasm.  Zurie has noted pain in her left-sided neck for about 36 hours.  It is improving today, but is still bothersome. She has no other symptoms and otherwise feels well, neck flexion appears normal on video.  I therefore do not suspect meningitis.  However, I have warned her that if she develops symptoms of illness, feels like she has the flu, or is otherwise getting worse please seek immediate care  Otherwise suggested that she use ibuprofen and/or Tylenol, heat (perhaps a heat patch).  Also prescribed Flexeril for her to use as needed.  Cautioned her that this can cause drowsiness and she should not use it when driving  All questions invited and answered.  Meds ordered this encounter  Medications  . cyclobenzaprine (FLEXERIL) 10 MG tablet    Sig: Take 0.5-1 tablets (5-10 mg total) by mouth 2 (two) times daily as needed for muscle spasms. Do not take if driving    Dispense:  20 tablet    Refill:  0     Signed Abbe AmsterdamJessica Renad Jenniges, MD

## 2018-07-20 ENCOUNTER — Encounter: Payer: Self-pay | Admitting: Family Medicine

## 2018-07-20 ENCOUNTER — Telehealth: Payer: Self-pay

## 2018-07-20 NOTE — Telephone Encounter (Signed)
Copied from CRM [id number]. Topic: General - Other >> Jul 20, 2018  1:30 PM Laural Benes, [place] C wrote: Reason for CRM: pt says that she was seen for shoulder pain, pt says due to the pain she has been unable to work. Pt would like to know if provider could give her a work note to excuse her for the days that she has missed?   CB: [phone]

## 2018-07-20 NOTE — Telephone Encounter (Signed)
Please advise 

## 2018-08-04 NOTE — Progress Notes (Deleted)
[facility] [address], [address] [place], [place] [address] [phone]  Date:  08/06/2018   Name:  Dana Castro   DOB:  23-Oct-1995   MRN:  [id number]  PCP:  Pearline Cables, MD    Chief Complaint: No chief complaint on file.   History of Present Illness:  Dana Castro is a 23 y.o. very pleasant female patient who presents with the following:  Physical exam today for this generally healthy young woman We recently did a virtual visit for neck pain  Pap: UTD tdap is UTD Gardasil- done STI testing and general BW?  She works night shift for [employer]  Patient Active Problem List   Diagnosis Date Noted  . LLQ pain 10/25/2016  . Pyelonephritis 08/16/2013    Past Medical History:  Diagnosis Date  . Urinary tract infection     Past Surgical History:  Procedure Laterality Date  . NO PAST SURGERIES      Social History   Tobacco Use  . Smoking status: Current Some Day Smoker  . Smokeless tobacco: Never Used  Substance Use Topics  . Alcohol use: Yes    Comment: occasionally  . Drug use: Yes    Types: Marijuana    Family History  Problem Relation Age of Onset  . Diabetes Father   . Diabetes Paternal Grandmother   . Hypertension Paternal Grandmother   . Diabetes Other     No Known Allergies  Medication list has been reviewed and updated.  Current Outpatient Medications on File Prior to Visit  Medication Sig Dispense Refill  . cyclobenzaprine (FLEXERIL) 10 MG tablet Take 0.5-1 tablets (5-10 mg total) by mouth 2 (two) times daily as needed for muscle spasms. Do not take if driving 20 tablet 0  . norelgestromin-ethinyl estradiol (ORTHO EVRA) 150-35 MCG/24HR transdermal patch Place 1 patch onto the skin once a week. 3 patch 12   No current facility-administered medications on file prior to visit.     Review of Systems:  As per HPI- otherwise negative.   Physical Examination: There were no  vitals filed for this visit. There were no vitals filed for this visit. There is no height or weight on file to calculate BMI. Ideal Body Weight:    ***  Assessment and Plan: ***  Signed Abbe Amsterdam, MD

## 2018-08-06 ENCOUNTER — Encounter: Payer: BLUE CROSS/BLUE SHIELD | Admitting: Family Medicine

## 2018-08-27 ENCOUNTER — Ambulatory Visit: Payer: Self-pay

## 2018-08-27 NOTE — Telephone Encounter (Signed)
Patient called and asked how to take Prednisone and Meloxica. I asked who prescribed it and when. She says she had a phone visit with a doctor through the [facility] Website after she didn't receive a call back from the office on Friday. I advised because those medications and the visit are not showing in her chart, she will need to contact the pharmacy to receive instructions on how to take the medication, she verbalized understanding. I advised to contact the office for a follow up visit, if needed.  Reason for Disposition . Caller has medication question only, adult not sick, and triager answers question  Protocols used: MEDICATION QUESTION CALL-A-AH

## 2018-08-30 ENCOUNTER — Other Ambulatory Visit: Payer: Self-pay

## 2018-08-30 ENCOUNTER — Encounter (HOSPITAL_COMMUNITY): Payer: Self-pay | Admitting: Emergency Medicine

## 2018-08-30 ENCOUNTER — Emergency Department (HOSPITAL_COMMUNITY)
Admission: EM | Admit: 2018-08-30 | Discharge: 2018-08-30 | Disposition: A | Discharge status: Home / Self Care | Payer: BC Managed Care – PPO | Attending: Emergency Medicine | Admitting: Emergency Medicine

## 2018-08-30 ENCOUNTER — Ambulatory Visit: Payer: Self-pay | Admitting: Family Medicine

## 2018-08-30 DIAGNOSIS — R112 Nausea with vomiting, unspecified: Secondary | ICD-10-CM | POA: Diagnosis not present

## 2018-08-30 DIAGNOSIS — Z79899 Other long term (current) drug therapy: Secondary | ICD-10-CM | POA: Insufficient documentation

## 2018-08-30 DIAGNOSIS — R809 Proteinuria, unspecified: Secondary | ICD-10-CM | POA: Insufficient documentation

## 2018-08-30 DIAGNOSIS — F17210 Nicotine dependence, cigarettes, uncomplicated: Secondary | ICD-10-CM | POA: Diagnosis not present

## 2018-08-30 LAB — CBC WITH DIFFERENTIAL/PLATELET
Abs Immature Granulocytes: 0.08 10*3/uL — ABNORMAL HIGH (ref 0.00–0.07)
Basophils Absolute: 0 10*3/uL (ref 0.0–0.1)
Basophils Relative: 0 %
Eosinophils Absolute: 0 10*3/uL (ref 0.0–0.5)
Eosinophils Relative: 0 %
HCT: 42.6 % (ref 36.0–46.0)
Hemoglobin: 14.1 g/dL (ref 12.0–15.0)
Immature Granulocytes: 1 %
Lymphocytes Relative: 5 %
Lymphs Abs: 0.6 10*3/uL — ABNORMAL LOW (ref 0.7–4.0)
MCH: 30.2 pg (ref 26.0–34.0)
MCHC: 33.1 g/dL (ref 30.0–36.0)
MCV: 91.2 fL (ref 80.0–100.0)
Monocytes Absolute: 0.1 10*3/uL (ref 0.1–1.0)
Monocytes Relative: 1 %
Neutro Abs: 12.4 10*3/uL — ABNORMAL HIGH (ref 1.7–7.7)
Neutrophils Relative %: 93 %
Platelets: 340 10*3/uL (ref 150–400)
RBC: 4.67 MIL/uL (ref 3.87–5.11)
RDW: 13.2 % (ref 11.5–15.5)
WBC: 13.3 10*3/uL — ABNORMAL HIGH (ref 4.0–10.5)
nRBC: 0 % (ref 0.0–0.2)

## 2018-08-30 LAB — COMPREHENSIVE METABOLIC PANEL
ALT: 18 U/L (ref 0–44)
AST: 33 U/L (ref 15–41)
Albumin: 4 g/dL (ref 3.5–5.0)
Alkaline Phosphatase: 48 U/L (ref 38–126)
Anion gap: 12 (ref 5–15)
BUN: 12 mg/dL (ref 6–20)
CO2: 24 mmol/L (ref 22–32)
Calcium: 9.2 mg/dL (ref 8.9–10.3)
Chloride: 103 mmol/L (ref 98–111)
Creatinine, Ser: 0.79 mg/dL (ref 0.44–1.00)
GFR calc Af Amer: >60 mL/min (ref >60)
GFR calc non Af Amer: >60 mL/min (ref >60)
Glucose, Bld: 123 mg/dL — ABNORMAL HIGH (ref 70–99)
Potassium: 3.8 mmol/L (ref 3.5–5.1)
Sodium: 139 mmol/L (ref 135–145)
Total Bilirubin: 0.6 mg/dL (ref 0.3–1.2)
Total Protein: 7.7 g/dL (ref 6.5–8.1)

## 2018-08-30 LAB — URINALYSIS, ROUTINE W REFLEX MICROSCOPIC
Bilirubin Urine: NEGATIVE
Glucose, UA: NEGATIVE mg/dL
Hgb urine dipstick: NEGATIVE
Ketones, ur: 20 mg/dL — AB
Leukocytes,Ua: NEGATIVE
Nitrite: NEGATIVE
Protein, ur: 30 mg/dL — AB
Specific Gravity, Urine: 1.026 (ref 1.005–1.030)
pH: 7 (ref 5.0–8.0)

## 2018-08-30 LAB — I-STAT BETA HCG BLOOD, ED (MC, WL, AP ONLY): I-stat hCG, quantitative: <5 m[IU]/mL (ref <5)

## 2018-08-30 LAB — LIPASE, BLOOD: Lipase: 24 U/L (ref 11–51)

## 2018-08-30 MED ORDER — METOCLOPRAMIDE HCL 5 MG/ML IJ SOLN
10.0000 mg | Freq: Once | INTRAMUSCULAR | Status: AC
  Administered 2018-08-30: 10 mg via INTRAVENOUS
  Filled 2018-08-30: qty 2

## 2018-08-30 MED ORDER — LACTATED RINGERS IV BOLUS
1000.0000 mL | Freq: Once | INTRAVENOUS | Status: AC
  Administered 2018-08-30: 1000 mL via INTRAVENOUS

## 2018-08-30 MED ORDER — FAMOTIDINE IN NACL 20-0.9 MG/50ML-% IV SOLN
20.0000 mg | Freq: Once | INTRAVENOUS | Status: AC
  Administered 2018-08-30: 20 mg via INTRAVENOUS
  Filled 2018-08-30: qty 50

## 2018-08-30 MED ORDER — DIPHENHYDRAMINE HCL 50 MG/ML IJ SOLN
25.0000 mg | Freq: Once | INTRAMUSCULAR | Status: AC
  Administered 2018-08-30: 25 mg via INTRAVENOUS
  Filled 2018-08-30: qty 1

## 2018-08-30 MED ORDER — ONDANSETRON 4 MG PO TBDP: 4.0000 mg | ORAL_TABLET | Freq: Three times a day (TID) | ORAL | 0 refills | Status: DC | PRN

## 2018-08-30 NOTE — ED Provider Notes (Signed)
[facility] EMERGENCY [facility] Provider Note   CSN: [id number] Arrival date & time: 08/30/18  1012     History   Chief Complaint No chief complaint on file.   HPI Dana Castro is a 23 y.o. female.     HPI  Patient is a 23 year old female past medical history of urinary tract infection presenting for nausea and vomiting.  Patient reports that she woke up around 3 AM vomiting and has had approximately 7-8 episodes of nonbilious and nonbloody vomiting.  She does report a "yellowish" tinge to it.  She does report some epigastric pain that worsens after she vomits.  Denies otherwise focal tenderness.  She denies any diarrhea.  No bowel movement today.  She denies any fevers or chills.  Patient is also reporting a frontal headache.  Patient reports that she drank a half a bottle of vodka last night which is unusual for her.  She also reports that she smokes marijuana daily.  She has never had any vomiting syndrome as a result of marijuana use before.  Past Medical History:  Diagnosis Date  . Urinary tract infection     Patient Active Problem List   Diagnosis Date Noted  . LLQ pain 10/25/2016  . Pyelonephritis 08/16/2013    Past Surgical History:  Procedure Laterality Date  . NO PAST SURGERIES       OB History   No obstetric history on file.      Home Medications    Prior to Admission medications   Medication Sig Start Date End Date Taking? Authorizing Provider  cyclobenzaprine (FLEXERIL) 10 MG tablet Take 0.5-1 tablets (5-10 mg total) by mouth 2 (two) times daily as needed for muscle spasms. Do not take if driving 0/34/745/13/20   Copland, Gwenlyn FoundJessica C, MD  norelgestromin-ethinyl estradiol (ORTHO EVRA) 150-35 MCG/24HR transdermal patch Place 1 patch onto the skin once a week. 11/23/17   Donato SchultzLowne Chase, Yvonne R, DO    Family History Family History  Problem Relation Age of Onset  . Diabetes Father   . Diabetes Paternal Grandmother   . Hypertension Paternal  Grandmother   . Diabetes Other     Social History Social History   Tobacco Use  . Smoking status: Current Some Day Smoker  . Smokeless tobacco: Never Used  Substance Use Topics  . Alcohol use: Yes    Comment: occasionally  . Drug use: Yes    Types: Marijuana     Allergies   Patient has no known allergies.   Review of Systems Review of Systems  Constitutional: Negative for chills and fever.  HENT: Negative for congestion, rhinorrhea, sinus pain and sore throat.   Eyes: Negative for visual disturbance.  Respiratory: Negative for cough, chest tightness and shortness of breath.   Cardiovascular: Negative for chest pain.  Gastrointestinal: Positive for abdominal pain, nausea and vomiting. Negative for constipation and diarrhea.  Genitourinary: Negative for dysuria, flank pain and urgency.  Musculoskeletal: Negative for back pain and myalgias.  Skin: Negative for rash.  Neurological: Positive for headaches. Negative for dizziness and light-headedness.     Physical Exam Updated Vital Signs BP 137/79 (BP Location: Right Arm)   Pulse 98   Temp 98.3 F (36.8 C) (Oral)   Resp 14   Ht 5' (1.524 m)   Wt 52.2 kg   LMP 08/03/2018 (Approximate)   SpO2 98%   BMI 22.46 kg/m   Physical Exam Vitals signs and nursing note reviewed.  Constitutional:  General: She is not in acute distress.    Appearance: She is well-developed.  HENT:     Head: Normocephalic and atraumatic.     Mouth/Throat:     Mouth: Mucous membranes are moist.  Eyes:     Conjunctiva/sclera: Conjunctivae normal.     Pupils: Pupils are equal, round, and reactive to light.  Neck:     Musculoskeletal: Normal range of motion and neck supple.  Cardiovascular:     Rate and Rhythm: Normal rate and regular rhythm.     Heart sounds: S1 normal and S2 normal. No murmur.  Pulmonary:     Effort: Pulmonary effort is normal.     Breath sounds: Normal breath sounds. No wheezing or rales.  Abdominal:     General:  Bowel sounds are normal. There is no distension.     Palpations: Abdomen is soft.     Tenderness: There is abdominal tenderness. There is no guarding.     Comments: Mild epigastric TTP w/o guarding or rebound.   Musculoskeletal: Normal range of motion.        General: No deformity.  Lymphadenopathy:     Cervical: No cervical adenopathy.  Skin:    General: Skin is warm and dry.     Findings: No erythema or rash.  Neurological:     Mental Status: She is alert.     Comments: Cranial nerves grossly intact. Patient moves extremities symmetrically and with good coordination.  Psychiatric:        Behavior: Behavior normal.        Thought Content: Thought content normal.        Judgment: Judgment normal.      ED Treatments / Results  Labs (all labs ordered are listed, but only abnormal results are displayed) Labs Reviewed  COMPREHENSIVE METABOLIC PANEL - Abnormal; Notable for the following components:      Result Value   Glucose, Bld 123 (*)    All other components within normal limits  CBC WITH DIFFERENTIAL/PLATELET - Abnormal; Notable for the following components:   WBC 13.3 (*)    Neutro Abs 12.4 (*)    Lymphs Abs 0.6 (*)    Abs Immature Granulocytes 0.08 (*)    All other components within normal limits  URINALYSIS, ROUTINE W REFLEX MICROSCOPIC - Abnormal; Notable for the following components:   APPearance HAZY (*)    Ketones, ur 20 (*)    Protein, ur 30 (*)    Bacteria, UA RARE (*)    All other components within normal limits  LIPASE, BLOOD  I-STAT BETA HCG BLOOD, ED ([facility], [facility], [facility] ONLY)    EKG    Radiology No results found.  Procedures Procedures (including critical care time)  Medications Ordered in ED Medications  lactated ringers bolus 1,000 mL (1,000 mLs Intravenous New Bag/Given 08/30/18 1202)  famotidine (PEPCID) IVPB 20 mg premix (0 mg Intravenous Stopped 08/30/18 1202)  metoCLOPramide (REGLAN) injection 10 mg (10 mg Intravenous Given 08/30/18 1146)   diphenhydrAMINE (BENADRYL) injection 25 mg (25 mg Intravenous Given 08/30/18 1146)     Initial Impression / Assessment and Plan / ED Course  I have reviewed the triage vital signs and the nursing notes.  Pertinent labs & imaging results that were available during my care of the patient were reviewed by me and considered in my medical decision making (see chart for details).  Clinical Course as of Aug 30 1318  Thu Aug 30, 2018  1218 Ketones, ur(!): 20 [AM]  1235 Pt  reports feeling better. Will PO challenge.    [AM]  1314 Pregnancy test negative.   I-Stat beta hCG blood, ED [AM]  1316 Tolerating PO. Feels much better. Repeat abdominal exam benign.    [AM]    Clinical Course User Index [AM] Elisha PonderMurray, Merrel Crabbe B, PA-C       This is a well-appearing 23 year old female with no significant past medical history presenting for nausea and vomiting.  Differential diagnosis includes viral gastroenteritis, gastritis, pancreatitis, cholecystitis.  She is having epigastric pain without focal abdominal tenderness.  Doubt appendicitis or cholecystitis.  More suspicious for cannabis hyperemesis versus vomiting as a secondary to unusually heavy alcohol use yesterday.  Lab work demonstrating slight leukocytosis of 13.3, likely stress demargination.  CMP without evidence of acute abnormality and without laboratory evidence of dehydration.  Urinalysis shows ketones and proteins in the urine.  Patient was started to follow-up with primary care to make sure protein resolves.  Patient tolerated p.o. and repeat abdominal exams remain benign.  She is instructed to return for any intractable nausea vomiting or focal abdominal pain.  Patient is in understanding and agrees with plan of care.  Final Clinical Impressions(s) / ED Diagnoses   Final diagnoses:  Non-intractable vomiting with nausea, unspecified vomiting type  Proteinuria, unspecified type    ED Discharge Orders         Ordered    ondansetron (ZOFRAN  ODT) 4 MG disintegrating tablet  Every 8 hours PRN     08/30/18 1320           Elisha PonderMurray, Makaio Mach B, PA-C 08/30/18 1321    Sabas SousBero, Michael M, MD 09/05/18 [phone]

## 2018-08-30 NOTE — Telephone Encounter (Signed)
Pt reports abdominal pain, constant 8/10, mid upper abdomen. Onset 0300. Vomiting, "More than 7-8 times, I lost count." since 0300. Denies diarrhea, unsure of febrile. States emesis is now "Yellowish."  Unable to stay hydrated, "All comes back up." States pain radiates through ribs at times. Pt directed to ED, states will follow disposition, mother will drive.   Reason for Disposition . [1] SEVERE pain (e.g., excruciating) AND [2] present > 1 hour  Answer Assessment - Initial Assessment Questions 1. LOCATION: "Where does it hurt?"      Mid upper abdomen 2. RADIATION: "Does the pain shoot anywhere else?" (e.g., chest, back)     To ribs 3. ONSET: "When did the pain begin?" (e.g., minutes, hours or days ago)      0300 this am 4. SUDDEN: "Gradual or sudden onset?"     sudden 5. PATTERN "Does the pain come and go, or is it constant?"    - If constant: "Is it getting better, staying the same, or worsening?"      (Note: Constant means the pain never goes away completely; most serious pain is constant and it progresses)     - If intermittent: "How long does it last?" "Do you have pain now?"     (Note: Intermittent means the pain goes away completely between bouts)     constant 6. SEVERITY: "How bad is the pain?"  (e.g., Scale 1-10; mild, moderate, or severe)   - MILD (1-3): doesn't interfere with normal activities, abdomen soft and not tender to touch    - MODERATE (4-7): interferes with normal activities or awakens from sleep, tender to touch    - SEVERE (8-10): excruciating pain, doubled over, unable to do any normal activities      8/10 7. RECURRENT SYMPTOM: "Have you ever had this type of abdominal pain before?" If so, ask: "When was the last time?" and "What happened that time?"  no     8. CAUSE: "What do you think is causing the abdominal pain?"     *unsure 9. RELIEVING/AGGRAVATING FACTORS: "What makes it better or worse?" (e.g., movement, antacids, bowel movement)     no 10. OTHER  SYMPTOMS: "Has there been any vomiting, diarrhea, constipation, or urine problems?"      Vomiting >7 times 11. PREGNANCY: "Is there any chance you are pregnant?" "When was your last menstrual period?"       no  Protocols used: ABDOMINAL PAIN - [facility]

## 2018-08-30 NOTE — ED Notes (Signed)
Pt given ginger ale and saltines.  

## 2018-08-30 NOTE — ED Notes (Signed)
Per Clarise Cruz in lab- result is not crossing over. I-stat beta hcg is negative.

## 2018-08-30 NOTE — ED Notes (Signed)
Patient verbalizes understanding of discharge instructions. Opportunity for questioning and answers were provided. Armband removed by staff, pt discharged from ED.  

## 2018-08-30 NOTE — Discharge Instructions (Signed)
Please read and follow all provided instructions.  Your diagnoses today include:  1. Non-intractable vomiting with nausea, unspecified vomiting type     Tests performed today include: Blood counts and electrolytes Blood tests to check liver and kidney function Blood tests to check pancreas function Urine test to look for infection and pregnancy (in women) Vital signs. See below for your results today.   Medications prescribed:   Take any prescribed medications only as directed.  Home care instructions:  Follow any educational materials contained in this packet.  Your abdominal pain, nausea, vomiting, and diarrhea may be caused by a viral gastroenteritis also called 'stomach flu'. You should rest for the next several days. Keep drinking plenty of fluids and use the medicine for nausea as directed.   Drink clear liquids for the next 24 hours and introduce solid foods slowly after 24 hours using the b.r.a.t. diet (Bananas, Rice, Applesauce, Toast, Yogurt).    Follow-up instructions: Please follow-up with your primary care provider in the next 2 days for further evaluation of your symptoms. If you are not feeling better in 48 hours you may have a condition that is more serious and you need re-evaluation.   Return instructions:  SEEK IMMEDIATE MEDICAL ATTENTION IF: If you have pain that does not go away or becomes severe  A temperature above 101F develops  Repeated vomiting occurs (multiple episodes)  If you have pain that becomes localized to portions of the abdomen. The right side could possibly be appendicitis. In an adult, the left lower portion of the abdomen could be colitis or diverticulitis.  Blood is being passed in stools or vomit (bright red or black tarry stools)  You develop chest pain, difficulty breathing, dizziness or fainting, or become confused, poorly responsive, or inconsolable (young children) If you have any other emergent concerns regarding your health  Additional  Information: Abdominal (belly) pain can be caused by many things. Your caregiver performed an examination and possibly ordered blood/urine tests and imaging (CT scan, x-rays, ultrasound). Many cases can be observed and treated at home after initial evaluation in the emergency department. Even though you are being discharged home, abdominal pain can be unpredictable. Therefore, you need a repeated exam if your pain does not resolve, returns, or worsens. Most patients with abdominal pain don't have to be admitted to the hospital or have surgery, but serious problems like appendicitis and gallbladder attacks can start out as nonspecific pain. Many abdominal conditions cannot be diagnosed in one visit, so follow-up evaluations are very important.  Your vital signs today were: BP 137/79 (BP Location: Right Arm)    Pulse 98    Temp 98.3 F (36.8 C) (Oral)    Resp 14    Ht 5' (1.524 m)    Wt 52.2 kg    LMP 08/03/2018 (Approximate)    SpO2 98%    BMI 22.46 kg/m  If your blood pressure (bp) was elevated above 135/85 this visit, please have this repeated by your doctor within one month. --------------

## 2018-08-30 NOTE — ED Triage Notes (Addendum)
Pt states she has been vomiting since 0300 this am. Also endorses headache and abdominal pain. Pt also reports drinking about half of a 788ml bottle of Vodka last PM

## 2018-08-30 NOTE — ED Notes (Signed)
EDP at the bedside.  ?

## 2018-08-30 NOTE — ED Notes (Signed)
Spoke with PA- Langston Masker in regards to pt and she feels the pt is suited for [facility].
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
## 2020-11-30 ENCOUNTER — Ambulatory Visit (INDEPENDENT_AMBULATORY_CARE_PROVIDER_SITE_OTHER): Payer: Commercial Managed Care - PPO | Admitting: Family

## 2020-11-30 ENCOUNTER — Other Ambulatory Visit: Payer: Self-pay

## 2020-11-30 VITALS — BP 116/65 | HR 93 | Temp 98.5°F | Resp 16 | Wt 121.0 lb

## 2020-11-30 DIAGNOSIS — Z3201 Encounter for pregnancy test, result positive: Secondary | ICD-10-CM | POA: Diagnosis not present

## 2020-11-30 DIAGNOSIS — N912 Amenorrhea, unspecified: Secondary | ICD-10-CM

## 2020-11-30 LAB — POCT URINE PREGNANCY: Preg Test, Ur: POSITIVE — AB

## 2020-11-30 LAB — HCG, QUANTITATIVE, PREGNANCY: Quantitative HCG: 40212 m[IU]/mL

## 2020-11-30 MED ORDER — PRENATAL VITAMIN AND MINERAL 28-0.8 MG PO TABS: 1.0000 | ORAL_TABLET | Freq: Every day | ORAL | Status: DC

## 2020-11-30 NOTE — Assessment & Plan Note (Signed)
Recommended that she remain tobacco free, add prenatal vitamin. She is requesting HCG quant to help determine fetal gestational age.  Refer to OB/GYN. Discussed saltines, ginger tea/candied ginger as needed to help with nausea.

## 2020-11-30 NOTE — Patient Instructions (Addendum)
Please complete lab work prior to leaving.  Add a prenatal vitamin once daily.  You should be contacted about scheduling your appointment with the OB/GYN.

## 2020-11-30 NOTE — Progress Notes (Signed)
Subjective:   By signing my name below, I, Shehryar Baig, attest that this documentation has been prepared under the direction and in the presence of Sandford Craze NP. 11/30/2020     Patient ID: Dana Castro, female    DOB: 31-Aug-1995, 25 y.o.   MRN: [id number]  Chief Complaint  Patient presents with   Suspected pregnancy    Patient's last menstrual period 10-27-20.     HPI Patient is in today for a office visit.  Her last menstrual cycle was 10/27/2020. She has taken 2 at home pregnancies test and is positive. Her in office pregnancy test is positive as well. She states that this pregnancy was a surprise and her partner is very excited. She is not as sure and unsure if she wants to continue the pregnancy or not.   Vomiting- She report struggling to eat food without vomiting and feeling nauseas.  Social history- She has a history of smoking daily but is no longer smoking at this time.  Muscle relaxer- She no longer takes 10 mg flexeril PRN PO.    Health Maintenance Due  Topic Date Due   COVID-19 Vaccine (1) Never done   Hepatitis C Screening  Never done   PAP-Cervical Cytology Screening  07/07/2020   PAP SMEAR-Modifier  07/07/2020   INFLUENZA VACCINE  10/05/2020    Past Medical History:  Diagnosis Date   Urinary tract infection     Past Surgical History:  Procedure Laterality Date   NO PAST SURGERIES      Family History  Problem Relation Age of Onset   Diabetes Father    Diabetes Paternal Grandmother    Hypertension Paternal Grandmother    Diabetes Other     Social History   Socioeconomic History   Marital status: Single    Spouse name: Not on file   Number of children: Not on file   Years of education: Not on file   Highest education level: Not on file  Occupational History   Not on file  Tobacco Use   Smoking status: Some Days   Smokeless tobacco: Never  Substance and Sexual Activity   Alcohol use: Yes    Comment: occasionally   Drug use:  Yes    Types: Marijuana   Sexual activity: Not Currently  Other Topics Concern   Not on file  Social History Narrative   Not on file   Social Determinants of Health   Financial Resource Strain: Not on file  Food Insecurity: Not on file  Transportation Needs: Not on file  Physical Activity: Not on file  Stress: Not on file  Social Connections: Not on file  Intimate Partner Violence: Not on file    Outpatient Medications Prior to Visit  Medication Sig Dispense Refill   cyclobenzaprine (FLEXERIL) 10 MG tablet Take 0.5-1 tablets (5-10 mg total) by mouth 2 (two) times daily as needed for muscle spasms. Do not take if driving (Patient not taking: Reported on 11/30/2020) 20 tablet 0   norelgestromin-ethinyl estradiol (ORTHO EVRA) 150-35 MCG/24HR transdermal patch Place 1 patch onto the skin once a week. (Patient not taking: Reported on 11/30/2020) 3 patch 12   ondansetron (ZOFRAN ODT) 4 MG disintegrating tablet Take 1 tablet (4 mg total) by mouth every 8 (eight) hours as needed for nausea or vomiting. 12 tablet 0   No facility-administered medications prior to visit.    No Known Allergies  Review of Systems  Gastrointestinal:  Positive for nausea and vomiting.  Objective:    Physical Exam Constitutional:      General: She is not in acute distress.    Appearance: Normal appearance. She is not ill-appearing.  HENT:     Head: Normocephalic and atraumatic.     Right Ear: External ear normal.     Left Ear: External ear normal.  Eyes:     Extraocular Movements: Extraocular movements intact.     Pupils: Pupils are equal, round, and reactive to light.  Cardiovascular:     Rate and Rhythm: Normal rate and regular rhythm.     Heart sounds: Normal heart sounds. No murmur heard.   No gallop.  Pulmonary:     Effort: Pulmonary effort is normal. No respiratory distress.     Breath sounds: Normal breath sounds. No wheezing or rales.  Skin:    General: Skin is warm and dry.   Neurological:     Mental Status: She is alert and oriented to person, place, and time.  Psychiatric:        Behavior: Behavior normal.        Judgment: Judgment normal.    BP 116/65 (BP Location: Right Arm, Patient Position: Sitting, Cuff Size: Small)   Pulse 93   Temp 98.5 F (36.9 C) (Oral)   Resp 16   Wt 121 lb (54.9 kg)   SpO2 98%   BMI 23.63 kg/m  Wt Readings from Last 3 Encounters:  11/30/20 121 lb (54.9 kg)  08/30/18 115 lb (52.2 kg)  03/14/18 117 lb (53.1 kg)       Assessment & Plan:   Problem List Items Addressed This Visit       Unprioritized   Positive pregnancy test    Recommended that she remain tobacco free, add prenatal vitamin. She is requesting HCG quant to help determine fetal gestational age.  Refer to OB/GYN. Discussed saltines, ginger tea/candied ginger as needed to help with nausea.        Relevant Orders   Ambulatory referral to Obstetrics / Gynecology   B-HCG Quant   Other Visit Diagnoses     Amenorrhea    -  Primary   Relevant Orders   POCT urine pregnancy (Completed)      28 minutes spent on today's visit. The majority of this time was spent counseling the patient.   Meds ordered this encounter  Medications   Prenatal Vit-Fe Fumarate-FA (PRENATAL VITAMIN AND MINERAL) 28-0.8 MG TABS    Sig: Take 1 tablet by mouth daily.    Dispense:  30 tablet    Order Specific Question:   Supervising Provider    Answer:   Bradd Canary [[id number]]    I, Sandford Craze NP, personally preformed the services described in this documentation.  All medical record entries made by the scribe were at my direction and in my presence.  I have reviewed the chart and discharge instructions (if applicable) and agree that the record reflects my personal performance and is accurate and complete. 11/30/2020   I,Shehryar Baig,acting as a scribe for Lemont Fillers, NP.,have documented all relevant documentation on the behalf of Lemont Fillers, NP,as  directed by  Lemont Fillers, NP while in the presence of Lemont Fillers, NP.   Lemont Fillers, NP

## 2020-12-03 ENCOUNTER — Telehealth: Payer: Self-pay | Admitting: Family

## 2020-12-03 NOTE — Telephone Encounter (Signed)
See mychart.  

## 2020-12-21 ENCOUNTER — Ambulatory Visit: Payer: Commercial Managed Care - PPO | Admitting: Family

## 2020-12-21 ENCOUNTER — Other Ambulatory Visit: Payer: Self-pay

## 2020-12-21 VITALS — BP 117/65 | HR 103 | Temp 98.5°F | Resp 16 | Wt 120.0 lb

## 2020-12-21 DIAGNOSIS — Z7189 Other specified counseling: Secondary | ICD-10-CM

## 2020-12-21 DIAGNOSIS — Z640 Problems related to unwanted pregnancy: Secondary | ICD-10-CM | POA: Diagnosis not present

## 2020-12-21 NOTE — Patient Instructions (Addendum)
Here are some numbers you can call to discuss pregnancy options:    [facility] [address] [phone]  [facility] [address]                         [phone]

## 2020-12-21 NOTE — Progress Notes (Signed)
Subjective:   By signing my name below, I, Shehryar Baig, attest that this documentation has been prepared under the direction and in the presence of Sandford Craze NP. 12/21/2020    Patient ID: Dana Castro, female    DOB: [birth year], 25 y.o.   MRN: [id number]  Chief Complaint  Patient presents with   Follow-up    Here for follow up on pregnancy    HPI Patient is in today for a office visit.   Pregnancy- last visit we saw patient for amenorrhea and confirmed a positive pregnancy test. Her LMP was 10/27/20. She has an upcoming appointment with OB/GYN in November, but states that she has been giving the pregnancy more consideration and has decided that she does not want to go through with the pregnancy and is requesting to get recommendation to a clinic that can assist her with termination. She reports AM nausea or but no vaginal bleeding/spotting at this time.   Health Maintenance Due  Topic Date Due   COVID-19 Vaccine (1) Never done   Hepatitis C Screening  Never done   PAP-Cervical Cytology Screening  07/07/2020   PAP SMEAR-Modifier  07/07/2020   INFLUENZA VACCINE  10/05/2020    Past Medical History:  Diagnosis Date   Urinary tract infection     Past Surgical History:  Procedure Laterality Date   NO PAST SURGERIES      Family History  Problem Relation Age of Onset   Diabetes Father    Diabetes Paternal Grandmother    Hypertension Paternal Grandmother    Diabetes Other     Social History   Socioeconomic History   Marital status: Single    Spouse name: Not on file   Number of children: Not on file   Years of education: Not on file   Highest education level: Not on file  Occupational History   Not on file  Tobacco Use   Smoking status: Some Days   Smokeless tobacco: Never  Substance and Sexual Activity   Alcohol use: Yes    Comment: occasionally   Drug use: Yes    Types: Marijuana   Sexual activity: Not Currently  Other Topics Concern   Not on  file  Social History Narrative   Not on file   Social Determinants of Health   Financial Resource Strain: Not on file  Food Insecurity: Not on file  Transportation Needs: Not on file  Physical Activity: Not on file  Stress: Not on file  Social Connections: Not on file  Intimate Partner Violence: Not on file    Outpatient Medications Prior to Visit  Medication Sig Dispense Refill   Prenatal Vit-Fe Fumarate-FA (PRENATAL VITAMIN AND MINERAL) 28-0.8 MG TABS Take 1 tablet by mouth daily. 30 tablet    No facility-administered medications prior to visit.    No Known Allergies  Review of Systems  Gastrointestinal:  Negative for nausea.  Genitourinary:        (-)Spotting      Objective:    Physical Exam Constitutional:      General: She is not in acute distress.    Appearance: Normal appearance. She is not ill-appearing.  HENT:     Head: Normocephalic and atraumatic.     Right Ear: External ear normal.     Left Ear: External ear normal.  Eyes:     Extraocular Movements: Extraocular movements intact.     Pupils: Pupils are equal, round, and reactive to light.  Neurological:     Mental  Status: She is alert and oriented to person, place, and time.  Psychiatric:        Behavior: Behavior normal.        Judgment: Judgment normal.    BP 117/65 (BP Location: Right Arm, Patient Position: Sitting, Cuff Size: Small)   Pulse (!) 103   Temp 98.5 F (36.9 C) (Oral)   Resp 16   Wt 120 lb (54.4 kg)   SpO2 98%   BMI 23.44 kg/m  Wt Readings from Last 3 Encounters:  12/21/20 120 lb (54.4 kg)  11/30/20 121 lb (54.9 kg)  08/30/18 115 lb (52.2 kg)       Assessment & Plan:   Problem List Items Addressed This Visit       Unprioritized   Unwanted pregnancy with plans for termination - Primary    Patient was given some number to call for local abortion clinics.  I answered her questions to the best of my ability, but advised her that the abortion clinic providers can give her  more in depth description of the procedures, risks, etc.  Patient verbalizes understanding.         No orders of the defined types were placed in this encounter.   I, Sandford Craze NP, personally preformed the services described in this documentation.  All medical record entries made by the scribe were at my direction and in my presence.  I have reviewed the chart and discharge instructions (if applicable) and agree that the record reflects my personal performance and is accurate and complete. 12/21/2020   I,Shehryar Baig,acting as a Neurosurgeon for Lemont Fillers, NP.,have documented all relevant documentation on the behalf of Lemont Fillers, NP,as directed by  Lemont Fillers, NP while in the presence of Lemont Fillers, NP.   Lemont Fillers, NP

## 2020-12-21 NOTE — Assessment & Plan Note (Signed)
Patient was given some number to call for local abortion clinics.  I answered her questions to the best of my ability, but advised her that the abortion clinic providers can give her more in depth description of the procedures, risks, etc.  Patient verbalizes understanding.

## 2021-01-05 ENCOUNTER — Encounter: Payer: Commercial Managed Care - PPO | Admitting: Advanced Practice Midwife

## 2021-01-11 ENCOUNTER — Encounter: Payer: Self-pay | Admitting: General Practice

## 2021-07-27 ENCOUNTER — Other Ambulatory Visit: Payer: Self-pay

## 2021-07-27 ENCOUNTER — Encounter (HOSPITAL_COMMUNITY): Payer: Self-pay

## 2021-07-27 ENCOUNTER — Emergency Department (HOSPITAL_COMMUNITY)
Admission: EM | Admit: 2021-07-27 | Discharge: 2021-07-27 | Disposition: A | Discharge status: Home / Self Care | Payer: Self-pay | Attending: Emergency Medicine | Admitting: Emergency Medicine

## 2021-07-27 DIAGNOSIS — N12 Tubulo-interstitial nephritis, not specified as acute or chronic: Secondary | ICD-10-CM | POA: Insufficient documentation

## 2021-07-27 DIAGNOSIS — N9489 Other specified conditions associated with female genital organs and menstrual cycle: Secondary | ICD-10-CM | POA: Insufficient documentation

## 2021-07-27 LAB — CBC WITH DIFFERENTIAL/PLATELET
Abs Immature Granulocytes: 0.06 10*3/uL (ref 0.00–0.07)
Basophils Absolute: 0.1 10*3/uL (ref 0.0–0.1)
Basophils Relative: 0 %
Eosinophils Absolute: 0.2 10*3/uL (ref 0.0–0.5)
Eosinophils Relative: 1 %
HCT: 41.1 % (ref 36.0–46.0)
Hemoglobin: 14.4 g/dL (ref 12.0–15.0)
Immature Granulocytes: 0 %
Lymphocytes Relative: 14 %
Lymphs Abs: 2.2 10*3/uL (ref 0.7–4.0)
MCH: 31.1 pg (ref 26.0–34.0)
MCHC: 35 g/dL (ref 30.0–36.0)
MCV: 88.8 fL (ref 80.0–100.0)
Monocytes Absolute: 1.3 10*3/uL — ABNORMAL HIGH (ref 0.1–1.0)
Monocytes Relative: 8 %
Neutro Abs: 12.1 10*3/uL — ABNORMAL HIGH (ref 1.7–7.7)
Neutrophils Relative %: 77 %
Platelets: 319 10*3/uL (ref 150–400)
RBC: 4.63 MIL/uL (ref 3.87–5.11)
RDW: 13.2 % (ref 11.5–15.5)
WBC: 15.9 10*3/uL — ABNORMAL HIGH (ref 4.0–10.5)
nRBC: 0 % (ref 0.0–0.2)

## 2021-07-27 LAB — COMPREHENSIVE METABOLIC PANEL
ALT: 15 U/L (ref 0–44)
AST: 30 U/L (ref 15–41)
Albumin: 4.1 g/dL (ref 3.5–5.0)
Alkaline Phosphatase: 72 U/L (ref 38–126)
Anion gap: 10 (ref 5–15)
BUN: 19 mg/dL (ref 6–20)
CO2: 22 mmol/L (ref 22–32)
Calcium: 8.8 mg/dL — ABNORMAL LOW (ref 8.9–10.3)
Chloride: 106 mmol/L (ref 98–111)
Creatinine, Ser: 0.71 mg/dL (ref 0.44–1.00)
GFR, Estimated: >60 mL/min (ref >60)
Glucose, Bld: 107 mg/dL — ABNORMAL HIGH (ref 70–99)
Potassium: 3.6 mmol/L (ref 3.5–5.1)
Sodium: 138 mmol/L (ref 135–145)
Total Bilirubin: 0.6 mg/dL (ref 0.3–1.2)
Total Protein: 7.6 g/dL (ref 6.5–8.1)

## 2021-07-27 LAB — URINALYSIS, ROUTINE W REFLEX MICROSCOPIC
Bacteria, UA: NONE SEEN
Bilirubin Urine: NEGATIVE
Glucose, UA: NEGATIVE mg/dL
Ketones, ur: 5 mg/dL — AB
Nitrite: NEGATIVE
Protein, ur: NEGATIVE mg/dL
Specific Gravity, Urine: 1.014 (ref 1.005–1.030)
WBC, UA: >50 WBC/hpf — ABNORMAL HIGH (ref 0–5)
pH: 7 (ref 5.0–8.0)

## 2021-07-27 LAB — I-STAT BETA HCG BLOOD, ED (MC, WL, AP ONLY): I-stat hCG, quantitative: <5 m[IU]/mL (ref <5)

## 2021-07-27 LAB — LIPASE, BLOOD: Lipase: 31 U/L (ref 11–51)

## 2021-07-27 MED ORDER — PROCHLORPERAZINE EDISYLATE 10 MG/2ML IJ SOLN
10.0000 mg | Freq: Once | INTRAMUSCULAR | Status: AC
  Administered 2021-07-27: 10 mg via INTRAVENOUS
  Filled 2021-07-27: qty 2

## 2021-07-27 MED ORDER — IBUPROFEN 200 MG PO TABS: 400.0000 mg | ORAL_TABLET | Freq: Once | ORAL | Status: DC

## 2021-07-27 MED ORDER — CEPHALEXIN 500 MG PO CAPS: 500.0000 mg | ORAL_CAPSULE | Freq: Three times a day (TID) | ORAL | 0 refills | Status: AC

## 2021-07-27 MED ORDER — KETOROLAC TROMETHAMINE 60 MG/2ML IM SOLN
30.0000 mg | Freq: Once | INTRAMUSCULAR | Status: AC
  Administered 2021-07-27: 30 mg via INTRAMUSCULAR
  Filled 2021-07-27: qty 2

## 2021-07-27 MED ORDER — ONDANSETRON 4 MG PO TBDP: 4.0000 mg | ORAL_TABLET | Freq: Three times a day (TID) | ORAL | 0 refills | Status: AC | PRN

## 2021-07-27 MED ORDER — SODIUM CHLORIDE 0.9 % IV BOLUS
1000.0000 mL | Freq: Once | INTRAVENOUS | Status: AC
  Administered 2021-07-27: 1000 mL via INTRAVENOUS

## 2021-07-27 MED ORDER — CEPHALEXIN 500 MG PO CAPS
500.0000 mg | ORAL_CAPSULE | Freq: Once | ORAL | Status: AC
  Administered 2021-07-27: 500 mg via ORAL
  Filled 2021-07-27: qty 1

## 2021-07-27 MED ORDER — ONDANSETRON 4 MG PO TBDP
4.0000 mg | ORAL_TABLET | Freq: Once | ORAL | Status: AC
  Administered 2021-07-27: 4 mg via ORAL
  Filled 2021-07-27: qty 1

## 2021-07-27 NOTE — ED Provider Notes (Signed)
[facility]-EMERGENCY DEPT Provider Note  CSN: [id number] Arrival date & time: 07/27/21 0320  Chief Complaint(s) Abdominal Pain, Back Pain, and Emesis  HPI Dana Castro is a 26 y.o. female {Add pertinent medical, surgical, social history, OB history to HPI:1}    Abdominal Pain Associated symptoms: vomiting   Back Pain Associated symptoms: abdominal pain   Emesis Associated symptoms: abdominal pain    Past Medical History Past Medical History:  Diagnosis Date   Urinary tract infection    Patient Active Problem List   Diagnosis Date Noted   Unwanted pregnancy with plans for termination 12/21/2020   Positive pregnancy test 11/30/2020   LLQ pain 10/25/2016   Pyelonephritis 08/16/2013   Home Medication(s) Prior to Admission medications   Medication Sig Start Date End Date Taking? Authorizing Provider  Prenatal Vit-Fe Fumarate-FA (PRENATAL VITAMIN AND MINERAL) 28-0.8 MG TABS Take 1 tablet by mouth daily. 11/30/20   Sandford Craze, NP                                                                                                                                    Allergies Patient has no known allergies.  Review of Systems Review of Systems  Gastrointestinal:  Positive for abdominal pain and vomiting.  Musculoskeletal:  Positive for back pain.  As noted in HPI  Physical Exam Vital Signs  I have reviewed the triage vital signs BP 122/86   Pulse 82   Temp 98 F (36.7 C) (Oral)   Resp 17   Ht 5' (1.524 m)   Wt 63.5 kg   SpO2 98%   BMI 27.34 kg/m  *** Physical Exam  ED Results and Treatments Labs (all labs ordered are listed, but only abnormal results are displayed) Labs Reviewed  COMPREHENSIVE METABOLIC PANEL - Abnormal; Notable for the following components:      Result Value   Glucose, Bld 107 (*)    Calcium 8.8 (*)    All other components within normal limits  CBC WITH DIFFERENTIAL/PLATELET - Abnormal; Notable for the following  components:   WBC 15.9 (*)    Neutro Abs 12.1 (*)    Monocytes Absolute 1.3 (*)    All other components within normal limits  URINALYSIS, ROUTINE W REFLEX MICROSCOPIC - Abnormal; Notable for the following components:   APPearance CLOUDY (*)    Hgb urine dipstick SMALL (*)    Ketones, ur 5 (*)    Leukocytes,Ua LARGE (*)    WBC, UA >50 (*)    All other components within normal limits  LIPASE, BLOOD  I-STAT BETA HCG BLOOD, ED ([facility], [facility], [facility] ONLY)  EKG  EKG Interpretation  Date/Time:    Ventricular Rate:    PR Interval:    QRS Duration:   QT Interval:    QTC Calculation:   R Axis:     Text Interpretation:         Radiology No results found.  Pertinent labs & imaging results that were available during my care of the patient were reviewed by me and considered in my medical decision making (see MDM for details).  Medications Ordered in ED Medications  ibuprofen (ADVIL) tablet 400 mg (has no administration in time range)  ondansetron (ZOFRAN-ODT) disintegrating tablet 4 mg (4 mg Oral Given 07/27/21 0445)  ketorolac (TORADOL) injection 30 mg (30 mg Intramuscular Given 07/27/21 0446)  cephALEXin (KEFLEX) capsule 500 mg (500 mg Oral Given 07/27/21 0445)                                                                                                                                     Procedures Procedures  (including critical care time)  Medical Decision Making / ED Course    Complexity of Problem:  Co-morbidities/SDOH that complicate the patient evaluation/care: ***  Additional history obtained: ***  Patient's presenting problem/concern, DDX, and MDM listed below: ***  Hospitalization Considered:  ***  Initial Intervention:  ***    Complexity of Data:   Cardiac Monitoring: ***  Laboratory Tests ordered listed below with my independent  interpretation: ***   Imaging Studies ordered listed below with my independent interpretation: ***     ED Course:    Assessment, Add'l Intervention, and Reassessment: ***  ***  Final Clinical Impression(s) / ED Diagnoses Final diagnoses:  None    {Document critical care time when appropriate:1}  {Document review of labs and clinical decision tools ie heart score, Chads2Vasc2 etc:1}  {Document your independent review of radiology images, and any outside records:1} {Document your discussion with family members, caretakers, and with consultants:1} {Document social determinants of health affecting pt's care:1} {Document your decision making why or why not admission, treatments were needed:1} This chart was dictated using voice recognition software.  Despite best efforts to proofread,  errors can occur which can change the documentation meaning.

## 2021-07-27 NOTE — ED Triage Notes (Signed)
Pt reports with abdominal pain, back pain, and vomiting since Saturday. Pt states that she feels like she has a UTI.

## 2022-01-18 DIAGNOSIS — H16223 Keratoconjunctivitis sicca, not specified as Sjogren's, bilateral: Secondary | ICD-10-CM | POA: Diagnosis not present

## 2022-01-18 DIAGNOSIS — H16252 Phlyctenular keratoconjunctivitis, left eye: Secondary | ICD-10-CM | POA: Diagnosis not present

## 2022-01-18 DIAGNOSIS — H11432 Conjunctival hyperemia, left eye: Secondary | ICD-10-CM | POA: Diagnosis not present

## 2022-02-04 DIAGNOSIS — H16252 Phlyctenular keratoconjunctivitis, left eye: Secondary | ICD-10-CM | POA: Diagnosis not present

## 2022-02-04 DIAGNOSIS — H16223 Keratoconjunctivitis sicca, not specified as Sjogren's, bilateral: Secondary | ICD-10-CM | POA: Diagnosis not present

## 2022-02-04 DIAGNOSIS — H11432 Conjunctival hyperemia, left eye: Secondary | ICD-10-CM | POA: Diagnosis not present

## 2022-03-31 DIAGNOSIS — Z6827 Body mass index (BMI) 27.0-27.9, adult: Secondary | ICD-10-CM | POA: Diagnosis not present

## 2022-03-31 DIAGNOSIS — H10023 Other mucopurulent conjunctivitis, bilateral: Secondary | ICD-10-CM | POA: Diagnosis not present

## 2022-03-31 DIAGNOSIS — R0981 Nasal congestion: Secondary | ICD-10-CM | POA: Diagnosis not present

## 2022-04-06 ENCOUNTER — Ambulatory Visit (INDEPENDENT_AMBULATORY_CARE_PROVIDER_SITE_OTHER): Payer: BC Managed Care – PPO | Admitting: Family

## 2022-04-06 VITALS — BP 108/62 | HR 133 | Temp 101.0°F | Resp 16

## 2022-04-06 DIAGNOSIS — J101 Influenza due to other identified influenza virus with other respiratory manifestations: Secondary | ICD-10-CM

## 2022-04-06 NOTE — Addendum Note (Signed)
Addended by: Debbrah Alar on: 04/06/2022 01:55 PM   Modules accepted: Level of Service

## 2022-04-06 NOTE — Assessment & Plan Note (Signed)
New. Febrile, increased heart rate today. She is advised to continue to push fluids and take tylenol every 6 hours as needed for fever/body aches. Discussed need to go to ER if severe/worsening symptoms.  Outside of window for tamiflu treatment. Note written for work excuse.

## 2022-04-06 NOTE — Progress Notes (Signed)
Subjective:   By signing my name below, I, Dana Castro, attest that this documentation has been prepared under the direction and in the presence of Debbrah Alar, NP. 04/06/2022   Patient ID: Dana Castro, female    DOB: May 12, 1995, 27 y.o.   MRN: [id number]  Chief Complaint  Patient presents with   Fever    Complains of temperature of up to 102 since Saturday    Cough    Complains of cough   Nasal Congestion    Complains of congestion    Fever  Associated symptoms include congestion and coughing. Pertinent negatives include no ear pain.  Cough Associated symptoms include a fever. Pertinent negatives include no ear pain.   Patient is in today for a office visit.   She complains of irritation in her eyes sine last Thursday 03/28/2022. She seen an urgent care and found she has a sinus infection that spread to her eyes. She developed a fever on Saturday. She then developed coughing and sneezing. She has a fever of 101 degrees F and started taking tylenol to manage her symptoms. She does not have a flu vaccine this year. She denies having any ear pain.    Past Medical History:  Diagnosis Date   Urinary tract infection     Past Surgical History:  Procedure Laterality Date   NO PAST SURGERIES      Family History  Problem Relation Age of Onset   Diabetes Father    Diabetes Paternal Grandmother    Hypertension Paternal Grandmother    Diabetes Other     Social History   Socioeconomic History   Marital status: Single    Spouse name: Not on file   Number of children: Not on file   Years of education: Not on file   Highest education level: Not on file  Occupational History   Not on file  Tobacco Use   Smoking status: Some Days   Smokeless tobacco: Never  Substance and Sexual Activity   Alcohol use: Yes    Comment: occasionally   Drug use: Yes    Types: Marijuana   Sexual activity: Not Currently  Other Topics Concern   Not on file  Social History  Narrative   Not on file   Social Determinants of Health   Financial Resource Strain: Not on file  Food Insecurity: Not on file  Transportation Needs: Not on file  Physical Activity: Not on file  Stress: Not on file  Social Connections: Not on file  Intimate Partner Violence: Not on file    Outpatient Medications Prior to Visit  Medication Sig Dispense Refill   Prenatal Vit-Fe Fumarate-FA (PRENATAL VITAMIN AND MINERAL) 28-0.8 MG TABS Take 1 tablet by mouth daily. 30 tablet    No facility-administered medications prior to visit.    No Known Allergies  Review of Systems  Constitutional:  Positive for fever.  HENT:  Positive for congestion. Negative for ear pain.   Respiratory:  Positive for cough.        Objective:    Physical Exam Constitutional:      General: She is not in acute distress.    Appearance: Normal appearance. She is not ill-appearing.  HENT:     Head: Normocephalic and atraumatic.     Right Ear: Tympanic membrane, ear canal and external ear normal.     Left Ear: Ear canal and external ear normal. There is impacted cerumen.     Mouth/Throat:     Mouth: Mucous  membranes are moist.     Pharynx: Posterior oropharyngeal erythema (mild) present. No oropharyngeal exudate.  Eyes:     Extraocular Movements: Extraocular movements intact.     Pupils: Pupils are equal, round, and reactive to light.  Cardiovascular:     Rate and Rhythm: Regular rhythm. Tachycardia present.     Heart sounds: Normal heart sounds. No murmur heard.    No gallop.  Pulmonary:     Effort: Pulmonary effort is normal. No respiratory distress.     Breath sounds: Normal breath sounds. No wheezing or rales.     Comments: Tested positive for flu B Skin:    General: Skin is warm and dry.  Neurological:     Mental Status: She is alert and oriented to person, place, and time.  Psychiatric:        Judgment: Judgment normal.     BP 108/62 (BP Location: Right Arm, Patient Position: Sitting,  Cuff Size: Small)   Pulse (!) 133   Temp (!) 101 F (38.3 C) (Oral)   Resp 16   SpO2 98%  Wt Readings from Last 3 Encounters:  07/27/21 140 lb (63.5 kg)  12/21/20 120 lb (54.4 kg)  11/30/20 121 lb (54.9 kg)       Assessment & Plan:  Influenza B Assessment & Plan: New. Febrile, increased heart rate today. She is advised to continue to push fluids and take tylenol every 6 hours as needed for fever/body aches. Discussed need to go to ER if severe/worsening symptoms.  Outside of window for tamiflu treatment. Note written for work excuse.      I, Nance Pear, NP, personally preformed the services described in this documentation.  All medical record entries made by the scribe were at my direction and in my presence.  I have reviewed the chart and discharge instructions (if applicable) and agree that the record reflects my personal performance and is accurate and complete. 04/06/2022   I,Dana Castro,acting as a Education administrator for Nance Pear, NP.,have documented all relevant documentation on the behalf of Nance Pear, NP,as directed by  Nance Pear, NP while in the presence of Nance Pear, NP.   Nance Pear, NP

## 2023-02-14 NOTE — Progress Notes (Unsigned)
[facility] [address] [place], [place] [address] [phone]  Date:  02/15/2023   Name:  Dana Castro   DOB:  1995/11/05   MRN:  [id number]  PCP:  Pearline Cables, MD    Chief Complaint: Possible Pregnancy (Pt found out last Monday that she is Pregnant. She has some concerns. She took pregnancy test at home. )   History of Present Illness:  Dana Castro is a 27 y.o. very pleasant female patient who presents with the following:  Patient seen today with recently discovered pregnancy Most recent visit with myself was actually over 4 years ago, May 2020 She took a test at home last week- + for pregnancy   LMP was 6-7 weeks ago- she is really not sure of the exact date, it did seem like a normal MP to her.  She would guess the last week of October before Halloween She feels generally good- no bleeding or spotting Breasts are tender She is not typically nauseated, no vomiting  However she is concerned about some pelvic cramping- this started last week It is happening most days since then and will occur several times a day, lasting a few minutes at a time    Patient Active Problem List   Diagnosis Date Noted   Influenza B 04/06/2022   Unwanted pregnancy with plans for termination 12/21/2020   Positive pregnancy test 11/30/2020   LLQ pain 10/25/2016   Pyelonephritis 08/16/2013    Past Medical History:  Diagnosis Date   Urinary tract infection     Past Surgical History:  Procedure Laterality Date   NO PAST SURGERIES      Social History   Tobacco Use   Smoking status: Some Days   Smokeless tobacco: Never  Substance Use Topics   Alcohol use: Yes    Comment: occasionally   Drug use: Yes    Types: Marijuana    Family History  Problem Relation Age of Onset   Diabetes Father    Diabetes Paternal Grandmother    Hypertension Paternal Grandmother    Diabetes Other     No Known  Allergies  Medication list has been reviewed and updated.  No current outpatient medications on file prior to visit.   No current facility-administered medications on file prior to visit.    Review of Systems:  As per HPI- otherwise negative.   Physical Examination: Vitals:   02/15/23 1514  BP: 110/60  Pulse: 93  Resp: 18  Temp: 98 F (36.7 C)  SpO2: 98%   Vitals:   02/15/23 1514  Weight: 144 lb 12.8 oz (65.7 kg)  Height: 5' (1.524 m)   Body mass index is 28.28 kg/m. Ideal Body Weight: Weight in (lb) to have BMI = 25: 127.7  GEN: no acute distress. Mild overweight, looks well  HEENT: Atraumatic, Normocephalic.  Ears and Nose: No external deformity. CV: RRR, No M/G/R. No JVD. No thrill. No extra heart sounds. PULM: CTA B, no wheezes, crackles, rhonchi. No retractions. No resp. distress. No accessory muscle use. ABD: S, NT, ND, +BS. No rebound. No HSM.  Belly is benign today EXTR: No c/c/e PSYCH: Normally interactive. Conversant.  Normal vulva except likely some HPV externally Normal vagina and cervix, tightly closed with no bleeding, no cervical motion tenderness.  No adnexal masses or tenderness.  Uterus is non-tender  Gonorrhea, chlamydia, trichomoniasis swab collected  Results for orders placed or performed in visit  on 02/15/23  POCT urine pregnancy  Result Value Ref Range   Preg Test, Ur Positive (A) Negative    Assessment and Plan: Missed menses - Plan: POCT urine pregnancy  Pelvic pain - Plan: Cervicovaginal ancillary only( [place])  Patient seen today with concern of early pregnancy and pelvic pain.  No bleeding.  Urine pregnancy test today confirms positive pregnancy.  She is not quite certain of her last menstrual period-  approximately 6 to [redacted] weeks along by LMP.  I called and discussed with OB fellow at [facility], due to pain we will have the patient go over there now for further evaluation and confirmation of IUP with ultrasound.  Patient  states understanding and agreement.  She feels fine to drive.  She will drive herself over to [facility] right now  I encouraged her to start her prenatal vitamins now, I will follow-up with her screening swab as above  Signed Abbe Amsterdam, MD

## 2023-02-15 ENCOUNTER — Inpatient Hospital Stay (HOSPITAL_COMMUNITY): Payer: BC Managed Care – PPO

## 2023-02-15 ENCOUNTER — Encounter (HOSPITAL_COMMUNITY): Payer: Self-pay | Admitting: Obstetrics and Gynecology

## 2023-02-15 ENCOUNTER — Other Ambulatory Visit (HOSPITAL_COMMUNITY)
Admission: RE | Admit: 2023-02-15 | Discharge: 2023-02-15 | Disposition: A | Discharge status: Home / Self Care | Payer: BC Managed Care – PPO | Source: Ambulatory Visit

## 2023-02-15 ENCOUNTER — Encounter: Payer: Self-pay | Admitting: Family Medicine

## 2023-02-15 ENCOUNTER — Ambulatory Visit: Payer: BC Managed Care – PPO | Admitting: Family Medicine

## 2023-02-15 ENCOUNTER — Inpatient Hospital Stay (HOSPITAL_COMMUNITY)
Admission: AD | Admit: 2023-02-15 | Discharge: 2023-02-15 | Disposition: A | Discharge status: Home / Self Care | Payer: BC Managed Care – PPO | Attending: Obstetrics & Gynecology | Admitting: Obstetrics & Gynecology

## 2023-02-15 VITALS — BP 110/60 | HR 93 | Temp 98.0°F | Resp 18 | Ht 60.0 in | Wt 144.8 lb

## 2023-02-15 DIAGNOSIS — Z3A01 Less than 8 weeks gestation of pregnancy: Secondary | ICD-10-CM | POA: Insufficient documentation

## 2023-02-15 DIAGNOSIS — R102 Pelvic and perineal pain: Secondary | ICD-10-CM | POA: Insufficient documentation

## 2023-02-15 DIAGNOSIS — N926 Irregular menstruation, unspecified: Secondary | ICD-10-CM

## 2023-02-15 DIAGNOSIS — O3480 Maternal care for other abnormalities of pelvic organs, unspecified trimester: Secondary | ICD-10-CM | POA: Diagnosis not present

## 2023-02-15 DIAGNOSIS — O26891 Other specified pregnancy related conditions, first trimester: Secondary | ICD-10-CM | POA: Diagnosis not present

## 2023-02-15 DIAGNOSIS — O3680X0 Pregnancy with inconclusive fetal viability, not applicable or unspecified: Secondary | ICD-10-CM | POA: Diagnosis not present

## 2023-02-15 DIAGNOSIS — R109 Unspecified abdominal pain: Secondary | ICD-10-CM | POA: Diagnosis not present

## 2023-02-15 DIAGNOSIS — O208 Other hemorrhage in early pregnancy: Secondary | ICD-10-CM | POA: Diagnosis not present

## 2023-02-15 HISTORY — DX: Unspecified ovarian cyst, unspecified side: N83.209

## 2023-02-15 HISTORY — DX: Urinary tract infection, site not specified: N39.0

## 2023-02-15 LAB — POCT URINE PREGNANCY: Preg Test, Ur: POSITIVE — AB

## 2023-02-15 LAB — COMPREHENSIVE METABOLIC PANEL
ALT: 17 U/L (ref 0–44)
AST: 22 U/L (ref 15–41)
Albumin: 3.7 g/dL (ref 3.5–5.0)
Alkaline Phosphatase: 71 U/L (ref 38–126)
Anion gap: 14 (ref 5–15)
BUN: 8 mg/dL (ref 6–20)
CO2: 21 mmol/L — ABNORMAL LOW (ref 22–32)
Calcium: 9.8 mg/dL (ref 8.9–10.3)
Chloride: 103 mmol/L (ref 98–111)
Creatinine, Ser: 0.49 mg/dL (ref 0.44–1.00)
GFR, Estimated: >60 mL/min (ref >60)
Glucose, Bld: 88 mg/dL (ref 70–99)
Potassium: 4 mmol/L (ref 3.5–5.1)
Sodium: 138 mmol/L (ref 135–145)
Total Bilirubin: 0.4 mg/dL (ref <1.2)
Total Protein: 7.1 g/dL (ref 6.5–8.1)

## 2023-02-15 LAB — CBC
HCT: 38.7 % (ref 36.0–46.0)
Hemoglobin: 13.4 g/dL (ref 12.0–15.0)
MCH: 29.9 pg (ref 26.0–34.0)
MCHC: 34.6 g/dL (ref 30.0–36.0)
MCV: 86.4 fL (ref 80.0–100.0)
Platelets: 358 10*3/uL (ref 150–400)
RBC: 4.48 MIL/uL (ref 3.87–5.11)
RDW: 12.3 % (ref 11.5–15.5)
WBC: 11.3 10*3/uL — ABNORMAL HIGH (ref 4.0–10.5)
nRBC: 0 % (ref 0.0–0.2)

## 2023-02-15 LAB — ABO/RH: ABO/RH(D): O POS

## 2023-02-15 LAB — HCG, QUANTITATIVE, PREGNANCY: hCG, Beta Chain, Quant, S: 12691 m[IU]/mL — ABNORMAL HIGH (ref <5)

## 2023-02-15 NOTE — MAU Provider Note (Signed)
Chief Complaint: Abdominal Pain  SUBJECTIVE HPI: Dana Castro is a 27 y.o. G2P0010 at [redacted]w[redacted]d by LMP who presents to maternity admissions reporting mild intermittent RLQ cramping x1 week. No pain now. No bleeding or spotting. LMP last week in October. No other concerns or complaints.   She denies vaginal bleeding, vaginal itching/burning, urinary symptoms, h/a, dizziness, n/v, or fever/chills.    HPI  Past Medical History:  Diagnosis Date   Ovarian cyst    high school   Urinary tract infection    UTI (urinary tract infection)    Past Surgical History:  Procedure Laterality Date   NO PAST SURGERIES     Social History   Socioeconomic History   Marital status: Single    Spouse name: Not on file   Number of children: Not on file   Years of education: Not on file   Highest education level: Not on file  Occupational History   Not on file  Tobacco Use   Smoking status: Former    Types: Cigarettes   Smokeless tobacco: Never  Vaping Use   Vaping status: Never Used  Substance and Sexual Activity   Alcohol use: Not Currently    Comment: occasionally   Drug use: Not Currently    Types: Marijuana   Sexual activity: Yes  Other Topics Concern   Not on file  Social History Narrative   Not on file   Social Determinants of Health   Financial Resource Strain: Not on file  Food Insecurity: Not on file  Transportation Needs: Not on file  Physical Activity: Not on file  Stress: Not on file  Social Connections: Unknown (07/20/2021)   Received from [facility]   Social Network    Social Network: Not on file  Intimate Partner Violence: Unknown (06/11/2021)   Received from [facility]   HITS    Physically Hurt: Not on file    Insult or Talk Down To: Not on file    Threaten Physical Harm: Not on file    Scream or Curse: Not on file   No current facility-administered medications on file prior to encounter.   No current outpatient medications on  file prior to encounter.   No Known Allergies  I have reviewed patient's Past Medical Hx, Surgical Hx, Family Hx, Social Hx, medications and allergies.   ROS:  Review of Systems Review of Systems  Other systems negative   Physical Exam  Physical Exam Patient Vitals for the past 24 hrs:  BP Temp Temp src Pulse Resp SpO2 Height Weight  02/15/23 1709 131/82 98.1 F (36.7 C) Oral 84 16 98 % 5' (1.524 m) 65.2 kg   Constitutional: Well-developed, well-nourished female in no acute distress.  Cardiovascular: normal rate Respiratory: normal effort GI: Abd soft, non-tender. Pos BS x 4 MS: Extremities nontender, no edema, normal ROM Neurologic: Alert and oriented x 4.  GU: Neg CVAT.   LAB RESULTS Results for orders placed or performed during the hospital encounter of 02/15/23 (from the past 24 hour(s))  ABO/Rh     Status: None   Collection Time: 02/15/23  5:11 PM  Result Value Ref Range   ABO/RH(D) O POS    No rh immune globuloin      NOT A RH IMMUNE GLOBULIN CANDIDATE, PT RH POSITIVE Performed at [facility], [address], [place], [place] [address]   CBC     Status: Abnormal   Collection Time: 02/15/23  5:15 PM  Result Value Ref Range   WBC 11.3 (H) 4.0 - 10.5 K/uL   RBC 4.48 3.87 - 5.11 MIL/uL   Hemoglobin 13.4 12.0 - 15.0 g/dL   HCT 40.9 81.1 - 91.4 %   MCV 86.4 80.0 - 100.0 fL   MCH 29.9 26.0 - 34.0 pg   MCHC 34.6 30.0 - 36.0 g/dL   RDW 78.2 95.6 - 21.3 %   Platelets 358 150 - 400 K/uL   nRBC 0.0 0.0 - 0.2 %  Comprehensive metabolic panel     Status: Abnormal   Collection Time: 02/15/23  5:15 PM  Result Value Ref Range   Sodium 138 135 - 145 mmol/L   Potassium 4.0 3.5 - 5.1 mmol/L   Chloride 103 98 - 111 mmol/L   CO2 21 (L) 22 - 32 mmol/L   Glucose, Bld 88 70 - 99 mg/dL   BUN 8 6 - 20 mg/dL   Creatinine, Ser 0.86 0.44 - 1.00 mg/dL   Calcium 9.8 8.9 - 57.8 mg/dL   Total Protein 7.1 6.5 - 8.1 g/dL   Albumin 3.7 3.5 - 5.0 g/dL   AST 22 15 - 41 U/L    ALT 17 0 - 44 U/L   Alkaline Phosphatase 71 38 - 126 U/L   Total Bilirubin 0.4 <1.2 mg/dL   GFR, Estimated >46 >96 mL/min   Anion gap 14 5 - 15  hCG, quantitative, pregnancy     Status: Abnormal   Collection Time: 02/15/23  5:15 PM  Result Value Ref Range   hCG, Beta Chain, Quant, S 12,691 (H) <5 mIU/mL    --/--/O POS (12/11 1711)  IMAGING US OB LESS THAN 14 WEEKS WITH OB TRANSVAGINAL  Result Date: 02/15/2023 CLINICAL DATA:  Initial evaluation for early pregnancy, unknown location. EXAM: OBSTETRIC <14 WK [place] AND TRANSVAGINAL OB US TECHNIQUE: Both transabdominal and transvaginal ultrasound examinations were performed for complete evaluation of the gestation as well as the maternal uterus, adnexal regions, and pelvic cul-de-sac. Transvaginal technique was performed to assess early pregnancy. COMPARISON:  None Available. FINDINGS: Intrauterine gestational sac: Single Yolk sac:  Present Embryo:  Negative Cardiac Activity: Negative Heart Rate: N/A MSD: 8.3 mm   5 w   4 d Subchorionic hemorrhage: Small subchorionic hemorrhage measuring 5 mm without mass effect. Maternal uterus/adnexae: Left ovary within normal limits. 3.5 cm simple right ovarian cyst noted. No other adnexal mass or free fluid. IMPRESSION: 1. Early intrauterine gestational sac with internal yolk sac, but no fetal pole or cardiac activity yet visualized. Recommend follow-up quantitative B-HCG levels and follow-up US in 10-14 days to confirm and assess viability. 2. Small subchorionic hemorrhage without mass effect. 3. 3.5 cm simple right ovarian cyst. Electronically Signed   By: Rise Mu M.D.   On: 02/15/2023 18:18    MAU Management/MDM: I have reviewed the triage vital signs and the nursing notes.   Pertinent labs & imaging results that were available during my care of the patient were reviewed by me and considered in my medical decision making (see chart for details).      I have reviewed her medical records including past  results, notes and treatments. Medical, Surgical, and family history were reviewed.  Medications and recent lab tests were reviewed  Ordered usual first trimester r/o ectopic labs.   Will check baseline Ultrasound to rule out ectopic.  Treatments in MAU included TVUS, CBC, ABO/Rh, bHCG quant, CMP.    This bleeding/pain can represent a normal pregnancy with bleeding, spontaneous abortion  or even an ectopic which can be life-threatening.  The process as listed above helps to determine which of these is present.  ASSESSMENT 1. [redacted] weeks gestation of pregnancy   2. Abdominal cramping   Early SIUP with GS and YS [redacted]w[redacted]d, no fetal pole yet visible. Normal adnexa.  - Suspect intermittent cramping of early pregnancy  Discussed medications safe in pregnancy, provided list.  Discussed food safety in pregnancy.   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 Pt agreeable to receive recommendations via MyChart.   PLAN Discharge home Establish care with OB/GYN of choice - Provider list provided Will repeat Ultrasound for viability in 2 weeks Return precautions provided  Pt stable at time of discharge. Encouraged to return here if she develops worsening of symptoms, increase in pain, fever, or other concerning symptoms.   Wyn Forster, MD FMOB Fellow, Faculty practice [facility], Center for [facility]  02/15/2023  7:19 PM

## 2023-02-15 NOTE — MAU Note (Signed)
Dana Castro is a 27 y.o. at [redacted]w[redacted]d here in MAU reporting: sent from office for further eval. Having pain in lower abd, more on the right side.  Comes and goes, kind of crampy like period is going to start. No bleeding. LMP: ~last wk in Oct Onset of complaint: last wk Pain score: none today, had been mild Vitals:   02/15/23 1709  BP: 131/82  Pulse: 84  Resp: 16  Temp: 98.1 F (36.7 C)  SpO2: 98%      Lab orders placed from triage:   Blood work drawn in triage

## 2023-02-15 NOTE — Patient Instructions (Addendum)
Please proceed to the [facility] triage unit at the Women and Childrens center at [facility]- they will check you out and make sure all is well with your pregnancy!    Maternity and women's care services located on the [place] side of [facility] [place]. [facility] (Entrance C off [address]). Valet service is available with extended hours.

## 2023-02-17 ENCOUNTER — Encounter: Payer: Self-pay | Admitting: Family Medicine

## 2023-02-17 LAB — CERVICOVAGINAL ANCILLARY ONLY
Chlamydia: NEGATIVE
Comment: NEGATIVE
Comment: NEGATIVE
Comment: NORMAL
Neisseria Gonorrhea: NEGATIVE
Trichomonas: NEGATIVE

## 2023-03-08 NOTE — L&D Delivery Note (Signed)
 OB/GYN Faculty Practice Delivery Note  Dana Castro is a 28 y.o. G2P0010 s/p SVD at [redacted]w[redacted]d. She was admitted for IOL for A2GDM.   ROM: 22h 39m with clear fluid GBS Status: Negative/-- (07/16 1727) Maximum Maternal Temperature: 61F  Labor Progress: Initial SVE: 3.5/50/-2. Ripened with foley balloon, 1hr. Augmented with AROM and Pitocin . Epidural. She then progressed to complete @0030 .   Delivery Date/Time: 10/08/2023 @0042   Delivery: Called to room and patient was complete and pushing. Head delivered LOA with 2 contractions. No nuchal cord present. Shoulder and body delivered in usual fashion. Infant with spontaneous cry, placed on mother's abdomen, dried and stimulated. Cord clamped x 2 after 1-minute delay, and cut by FOB. Cord blood collected. Placenta delivered spontaneously with gentle cord traction. Fundus firm with massage and Pitocin . Labia, perineum, vagina, and cervix inspected without lesion.   Baby Weight: 3118g  Placenta: 3 vessel, intact. Sent to L&D Complications: None Lacerations: None EBL: 58 mL Analgesia: Epidural   Infant:  APGAR (1 MIN): 9  APGAR (5 MINS): 9   Mardy Shropshire, MD OB Family Medicine Fellow, [facility], [facility] 10/08/2023, 2:09 AM

## 2023-04-20 ENCOUNTER — Telehealth: Payer: Self-pay | Admitting: Family Medicine

## 2023-04-20 DIAGNOSIS — Z3490 Encounter for supervision of normal pregnancy, unspecified, unspecified trimester: Secondary | ICD-10-CM

## 2023-04-20 NOTE — Telephone Encounter (Signed)
Copied from CRM [id number]. Topic: Appointments - Scheduling Inquiry for Clinic >> Apr 20, 2023  4:54 PM Denese Killings wrote: Reason for CRM: Patient wants to know if she can schedule for an Ultrasound.

## 2023-04-24 NOTE — Telephone Encounter (Signed)
Pt called for a referral to GYN. She was seen back in Dec for pos Pregnancy, admitted to the hospital for cramping has not seen OBGYN. She says no one that she has called is accepting NPTS.

## 2023-04-24 NOTE — Addendum Note (Signed)
Addended by: Abbe Amsterdam C on: 04/24/2023 05:13 PM   Modules accepted: Orders

## 2023-04-25 ENCOUNTER — Encounter: Payer: Self-pay | Admitting: Family Medicine

## 2023-05-02 ENCOUNTER — Telehealth: Payer: BC Managed Care – PPO

## 2023-05-02 DIAGNOSIS — Z3480 Encounter for supervision of other normal pregnancy, unspecified trimester: Secondary | ICD-10-CM | POA: Insufficient documentation

## 2023-05-02 NOTE — Progress Notes (Signed)
 New OB Intake  I connected with Dana Castro  on 05/02/23 at  2:15 PM EST by MyChart Video Visit and verified that I am speaking with the correct person using two identifiers. Nurse is located at [facility] and pt is located at home.  I discussed the limitations, risks, security and privacy concerns of performing an evaluation and management service by telephone and the availability of in person appointments. I also discussed with the patient that there may be a patient responsible charge related to this service. The patient expressed understanding and agreed to proceed.  I explained I am completing New OB Intake today. We discussed EDD of 10/08/2023, by Last Menstrual Period. Pt is G2P0010. I reviewed her allergies, medications and Medical/Surgical/OB history.    Patient Active Problem List   Diagnosis Date Noted   Supervision of other normal pregnancy, antepartum 05/02/2023   Influenza B 04/06/2022   Unwanted pregnancy with plans for termination 12/21/2020   Positive pregnancy test 11/30/2020   LLQ pain 10/25/2016   Pyelonephritis 08/16/2013    Concerns addressed today  Delivery Plans Plans to deliver at [facility]. Discussed the nature of our practice with multiple providers including residents and students. Due to the size of the practice, the delivering provider may not be the same as those providing prenatal care.   Patient is not interested in water birth. Offered upcoming OB visit with CNM to discuss further.  MyChart/Babyscripts MyChart access verified. I explained pt will have some visits in office and some virtually. Babyscripts instructions given and order placed. Patient verifies receipt of registration text/e-mail. Account successfully created and app downloaded. If patient is a candidate for Optimized scheduling, add to sticky note.   Blood Pressure Cuff/Weight Scale Blood pressure cuff ordered for patient to pick-up from [employer]. Explained after first prenatal appt  pt will check weekly and document in Babyscripts. Patient does have weight scale.  Anatomy US Explained first scheduled [place] will be around 19 weeks. Anatomy US scheduled for 05/23/23 at 0215p.  Is patient a CenteringPregnancy candidate?  Offer at [facility]   If accepted,    Is patient a Mom+Baby Combined Care candidate?     If accepted, confirm patient does not intend to move from the area for at least 12 months, then notify Mom+Baby staff  Interested in [place]? If yes, send referral and doula dot phrase.   Is patient a candidate for Babyscripts Optimization?    First visit review I reviewed new OB appt with patient. Explained pt will be seen by Dana Castro, CNM  at first visit. Discussed Dana Castro genetic screening with patient. Needs Panorama and Horizon.. Routine prenatal labs  needed at [facility] OB visit.    Last Pap Diagnosis  Date Value Ref Range Status  07/07/2017   Final   NEGATIVE FOR INTRAEPITHELIAL LESIONS OR MALIGNANCY.    Dana Castro, CMA 05/02/2023  2:50 PM

## 2023-05-09 ENCOUNTER — Other Ambulatory Visit (HOSPITAL_COMMUNITY)
Admission: RE | Admit: 2023-05-09 | Discharge: 2023-05-09 | Disposition: A | Discharge status: Home / Self Care | Source: Ambulatory Visit | Attending: Advanced Practice Midwife | Admitting: Advanced Practice Midwife

## 2023-05-09 ENCOUNTER — Other Ambulatory Visit: Payer: Self-pay

## 2023-05-09 ENCOUNTER — Ambulatory Visit: Payer: BC Managed Care – PPO | Admitting: Advanced Practice Midwife

## 2023-05-09 ENCOUNTER — Encounter: Payer: Self-pay | Admitting: Advanced Practice Midwife

## 2023-05-09 ENCOUNTER — Encounter: Payer: Self-pay | Admitting: Family Medicine

## 2023-05-09 VITALS — BP 117/75 | HR 99 | Wt 146.2 lb

## 2023-05-09 DIAGNOSIS — Z3482 Encounter for supervision of other normal pregnancy, second trimester: Secondary | ICD-10-CM | POA: Diagnosis not present

## 2023-05-09 DIAGNOSIS — Z3A18 18 weeks gestation of pregnancy: Secondary | ICD-10-CM | POA: Insufficient documentation

## 2023-05-09 DIAGNOSIS — Z3480 Encounter for supervision of other normal pregnancy, unspecified trimester: Secondary | ICD-10-CM | POA: Diagnosis not present

## 2023-05-09 DIAGNOSIS — N760 Acute vaginitis: Secondary | ICD-10-CM | POA: Insufficient documentation

## 2023-05-09 DIAGNOSIS — B9689 Other specified bacterial agents as the cause of diseases classified elsewhere: Secondary | ICD-10-CM | POA: Insufficient documentation

## 2023-05-09 DIAGNOSIS — N898 Other specified noninflammatory disorders of vagina: Secondary | ICD-10-CM | POA: Diagnosis not present

## 2023-05-09 DIAGNOSIS — O26892 Other specified pregnancy related conditions, second trimester: Secondary | ICD-10-CM | POA: Diagnosis not present

## 2023-05-09 LAB — POCT URINALYSIS DIP (DEVICE)
Bilirubin Urine: NEGATIVE
Glucose, UA: NEGATIVE mg/dL
Hgb urine dipstick: NEGATIVE
Ketones, ur: NEGATIVE mg/dL
Leukocytes,Ua: NEGATIVE
Nitrite: NEGATIVE
Protein, ur: NEGATIVE mg/dL
Specific Gravity, Urine: >=1.03 (ref 1.005–1.030)
Urobilinogen, UA: 0.2 mg/dL (ref 0.0–1.0)
pH: 5.5 (ref 5.0–8.0)

## 2023-05-09 NOTE — Progress Notes (Signed)
 INITIAL PRENATAL VISIT  Subjective:   Dana Castro is 28 y.o. G2P0010 female being seen today for her first obstetrical visit. She has not received other prenatal care previously this pregnancy. This is not a planned pregnancy. This is a desired pregnancy.  She is at 101w2d gestation by LMP.  Her obstetrical history is significant for  nothing  . Relationship with FOB: significant other, not living together. Patient does intend to breast feed. Pregnancy history fully reviewed.  Review of Systems:   ROS no bleeding and no leaking.  Objective:    Obstetric History OB History  Gravida Para Term Preterm AB Living  2    1   SAB IAB Ectopic Multiple Live Births   1       # Outcome Date GA Lbr Len/2nd Weight Sex Type Anes PTL Lv  2 Current           1 IAB 2022            Obstetric Comments  2022 pills    Past Medical History:  Diagnosis Date   Ovarian cyst    high school   Urinary tract infection    UTI (urinary tract infection)     Past Surgical History:  Procedure Laterality Date   NO PAST SURGERIES      Current Outpatient Medications on File Prior to Visit  Medication Sig Dispense Refill   Prenatal Vit-Fe Fumarate-FA (MULTIVITAMIN-PRENATAL) 27-0.8 MG TABS tablet Take 1 tablet by mouth daily at 12 noon.     No current facility-administered medications on file prior to visit.    No Known Allergies  Social History:  reports that she has quit smoking. Her smoking use included cigarettes. She has never used smokeless tobacco. She reports that she does not currently use alcohol. She reports that she does not currently use drugs after having used the following drugs: Marijuana.  Family History  Problem Relation Age of Onset   Heart disease Mother        pacemaker 2024   Diabetes Father    Diabetes Paternal Grandmother    Hypertension Paternal Grandmother    Diabetes Other     The following portions of the patient's history were reviewed and updated as  appropriate: allergies, current medications, past family history, past medical history, past social history, past surgical history and problem list.  Physical Exam:  BP 117/75   Pulse 99   Wt 146 lb 3.2 oz (66.3 kg)   LMP 01/01/2023   BMI 28.55 kg/m  CONSTITUTIONAL: Well-developed, well-nourished female in no acute distress.  HENT:  Normocephalic, atraumatic. Oropharynx is clear and moist EYES: Conjunctivae normal. No scleral icterus.  SKIN: Skin is warm and dry. No rash noted. Not diaphoretic. No erythema. No pallor. MUSCULOSKELETAL: Normal range of motion. No tenderness.  No cyanosis, clubbing, or edema.   NEUROLOGIC: Alert and oriented to person, place, and time. Normal muscle tone coordination.  PSYCHIATRIC: Normal mood and affect. Normal behavior. Normal judgment and thought content. CARDIOVASCULAR: Normal heart rate noted. RESPIRATORY: Effort and rate normal. BREASTS: Declined ABDOMEN: Soft, no distention, tenderness, rebound or guarding. Fundal ht: 19 PELVIC: Normal appearing external genitalia; normal appearing vaginal mucosa and cervix.  No abnormal discharge noted.  Pap smear obtained.  Uterus S=D, no other palpable masses, no uterine or adnexal tenderness. Fetal Status: Fetal Heart Rate (bpm): 146         Indications for ASA therapy (per uptodate) None  Assessment:   Pregnancy: G2P0010 1. Supervision  of other normal pregnancy, antepartum (Primary) - Anatomy US - Culture, OB Urine - CBC/D/Plt+RPR+Rh+ABO+RubIgG... - Hemoglobin A1c - PANORAMA PRENATAL TEST - HORIZON Basic Panel - Cytology - PAP( [place]) - AFP, Serum, Open Spina Bifida  2. [redacted] weeks gestation of pregnancy     Plan:  Initial labs drawn/reviewed. Prenatal vitamins. Rx ASA for reduction of risk for preeclampsia.  Problem list reviewed and updated. Genetic screening discussed: NIPS/First trimester screen/Quad/AFP ordered. Role of ultrasound in pregnancy discussed; Anatomy US:  ordered. Amniocentesis discussed: not indicated. Follow up in 4 weeks.Centering Discussed clinic routines, schedule of care and testing, genetic screening options, involvement of students and residents under the direct supervision of APPs and doctors and presence of female providers. Pt verbalized understanding.  [place], [place] 05/09/2023 3:43 PM

## 2023-05-11 LAB — AFP, SERUM, OPEN SPINA BIFIDA
AFP MoM: 1.02
AFP Value: 45.1 ng/mL
Gest. Age on Collection Date: 18 wk
Maternal Age At EDD: 27.2 a
OSBR Risk 1 IN: 10000
Test Results:: NEGATIVE
Weight: 146 [lb_av]

## 2023-05-11 LAB — CERVICOVAGINAL ANCILLARY ONLY
Bacterial Vaginitis (gardnerella): POSITIVE — AB
Candida Glabrata: NEGATIVE
Candida Vaginitis: NEGATIVE
Comment: NEGATIVE
Comment: NEGATIVE
Comment: NEGATIVE
Comment: NEGATIVE
Trichomonas: NEGATIVE

## 2023-05-11 LAB — CBC/D/PLT+RPR+RH+ABO+RUBIGG...
Antibody Screen: NEGATIVE
Basophils Absolute: 0.1 10*3/uL (ref 0.0–0.2)
Basos: 0 %
EOS (ABSOLUTE): 0.5 10*3/uL — ABNORMAL HIGH (ref 0.0–0.4)
Eos: 3 %
HCV Ab: NONREACTIVE
HIV Screen 4th Generation wRfx: NONREACTIVE
Hematocrit: 39.8 % (ref 34.0–46.6)
Hemoglobin: 13.1 g/dL (ref 11.1–15.9)
Hepatitis B Surface Ag: NEGATIVE
Immature Grans (Abs): 0 10*3/uL (ref 0.0–0.1)
Immature Granulocytes: 0 %
Lymphocytes Absolute: 2.6 10*3/uL (ref 0.7–3.1)
Lymphs: 18 %
MCH: 29.6 pg (ref 26.6–33.0)
MCHC: 32.9 g/dL (ref 31.5–35.7)
MCV: 90 fL (ref 79–97)
Monocytes Absolute: 1 10*3/uL — ABNORMAL HIGH (ref 0.1–0.9)
Monocytes: 7 %
Neutrophils Absolute: 10 10*3/uL — ABNORMAL HIGH (ref 1.4–7.0)
Neutrophils: 72 %
Platelets: 374 10*3/uL (ref 150–450)
RBC: 4.42 x10E6/uL (ref 3.77–5.28)
RDW: 12.7 % (ref 11.7–15.4)
RPR Ser Ql: NONREACTIVE
Rh Factor: POSITIVE
Rubella Antibodies, IGG: 1.42 {index} (ref >0.99)
WBC: 14.2 10*3/uL — ABNORMAL HIGH (ref 3.4–10.8)

## 2023-05-11 LAB — HCV INTERPRETATION

## 2023-05-11 LAB — CULTURE, OB URINE

## 2023-05-11 LAB — HEMOGLOBIN A1C
Est. average glucose Bld gHb Est-mCnc: 108 mg/dL
Hgb A1c MFr Bld: 5.4 % (ref 4.8–5.6)

## 2023-05-11 LAB — URINE CULTURE, OB REFLEX

## 2023-05-16 LAB — PANORAMA PRENATAL TEST FULL PANEL:PANORAMA TEST PLUS 5 ADDITIONAL MICRODELETIONS: FETAL FRACTION: 7.1

## 2023-05-17 LAB — CYTOLOGY - PAP
Chlamydia: NEGATIVE
Comment: NEGATIVE
Comment: NORMAL
Diagnosis: NEGATIVE
Neisseria Gonorrhea: NEGATIVE

## 2023-05-20 ENCOUNTER — Encounter: Payer: Self-pay | Admitting: Advanced Practice Midwife

## 2023-05-20 MED ORDER — METRONIDAZOLE 500 MG PO TABS: 500.0000 mg | ORAL_TABLET | Freq: Two times a day (BID) | ORAL | 0 refills | Status: DC

## 2023-05-20 NOTE — Addendum Note (Signed)
 Addended by: [name] on: 05/20/2023 06:12 PM   Modules accepted: Orders

## 2023-05-22 ENCOUNTER — Encounter: Payer: Self-pay | Admitting: Advanced Practice Midwife

## 2023-05-22 DIAGNOSIS — Z148 Genetic carrier of other disease: Secondary | ICD-10-CM | POA: Insufficient documentation

## 2023-05-22 LAB — HORIZON CUSTOM: REPORT SUMMARY: POSITIVE — AB

## 2023-05-23 ENCOUNTER — Other Ambulatory Visit: Payer: Self-pay | Admitting: *Deleted

## 2023-05-23 ENCOUNTER — Encounter: Payer: Self-pay | Admitting: *Deleted

## 2023-05-23 ENCOUNTER — Ambulatory Visit: Payer: BC Managed Care – PPO | Attending: Advanced Practice Midwife

## 2023-05-23 ENCOUNTER — Ambulatory Visit: Payer: BC Managed Care – PPO | Admitting: *Deleted

## 2023-05-23 VITALS — BP 112/66 | HR 112

## 2023-05-23 DIAGNOSIS — Z148 Genetic carrier of other disease: Secondary | ICD-10-CM | POA: Diagnosis not present

## 2023-05-23 DIAGNOSIS — Z3480 Encounter for supervision of other normal pregnancy, unspecified trimester: Secondary | ICD-10-CM | POA: Insufficient documentation

## 2023-05-23 DIAGNOSIS — Z3A20 20 weeks gestation of pregnancy: Secondary | ICD-10-CM | POA: Insufficient documentation

## 2023-05-23 DIAGNOSIS — Z363 Encounter for antenatal screening for malformations: Secondary | ICD-10-CM | POA: Diagnosis not present

## 2023-05-29 ENCOUNTER — Other Ambulatory Visit: Payer: Self-pay

## 2023-05-29 ENCOUNTER — Ambulatory Visit: Attending: Maternal & Fetal Medicine | Admitting: Obstetrics and Gynecology

## 2023-05-29 ENCOUNTER — Ambulatory Visit

## 2023-05-29 ENCOUNTER — Ambulatory Visit: Payer: Self-pay | Admitting: Maternal & Fetal Medicine

## 2023-05-29 DIAGNOSIS — O285 Abnormal chromosomal and genetic finding on antenatal screening of mother: Secondary | ICD-10-CM

## 2023-05-29 DIAGNOSIS — Z3A21 21 weeks gestation of pregnancy: Secondary | ICD-10-CM | POA: Diagnosis not present

## 2023-05-29 DIAGNOSIS — Z3480 Encounter for supervision of other normal pregnancy, unspecified trimester: Secondary | ICD-10-CM

## 2023-05-29 NOTE — Progress Notes (Cosign Needed Addendum)
 Virtual Visit via Video Note  I connected with Dana Castro on 05/29/23 at  1:00 PM EDT by a video enabled telemedicine application and verified that I am speaking with the correct person using two identifiers.  Location: Patient: work Provider: [facility]   Referring provider: Ivonne Andrew, CNM Length of Consultation: 40 minutes video consultation  Dana Castro was referred for genetic counseling with [facility] due to the results of her Spinal Muscular Atrophy (SMA) carrier screening.  We reviewed those results as well as additional screening and testing options for this pregnancy. The patient was present on this virtual visit alone.  Dana Castro had [facility] [employer] carrier screening performed through Dana Castro. The results of the screen identified her to be at increased risk for being a carrier for spinal muscular atrophy (SMA). The screening also included testing for cystic fibrosis, Beta-hemoglobin abnormalities including sickle cell disease and beta thalassemia and Alpha thalassemia.  The screening was negative for these conditions, which greatly reduces but cannot eliminate the chance that she is a carrier for these conditions. See that report for details and residual risk estimates. In addition, she elected to have Panorama screening for chromosome conditions.  These results were negative, meaning the pregnancy is at low risk, for Trisomy 13, 18, 21 and 45,X and triploidy.  SMA results: SMA is an autosomal recessive disease caused by loss of function mutations in the SMN1 gene. Typically, the SMN1 gene provides instructions for the Survival Motor Neuron (SMN) protein. This protein has important functions in the maintenance of motor neurons in the skeletal muscles that allow the body to move. Affected individuals with loss of function mutations in the SMN1 gene have a shortage of SMN protein which causes progressive motor neuron death, leading to the  symptoms of SMA. A similar gene, SMN2, provides instructions to create a full length SMN protein approximately 10% of the time. Thus, the number of copies of the SMN2 gene an affected individual has modifies the severity of symptoms they experience and classification between different types of the condition. The five types of SMA listed below are historically classified based on clinical presentation, onset of symptoms, and the maximum skill level of motor milestones achieved. There is a lot of overlap between types. Type Zero is the most severe and is characterized by symptom onset in utero and death soon after birth. Type I is the most common with symptom onset before six months of age and a two-year life expectancy. Infants with SMA type I typically have severe hypotonia, difficulty breathing, poor suck, and are unable to sit independently.  Type II has onset of symptoms between six to eighteen months of age. Individuals with SMA type II are able to sit without support, although they may not retain this skill, and they are not able to walk, have generalized muscle weakness, and may develop kyphoscoliosis.  Type III has onset of symptoms after eighteen months of age. Individuals with SMA type III may be able to sit and walk independently, although they may not retain these skills, and may develop scoliosis. Type IV has onset of symptoms in adulthood. Individuals with SMA type IV typically have mild motor impairment and respiratory problems. Several treatments are available for individuals affected with SMA and studies show that treatment is most effective when it is started in the first few months of life.  Dana Castro screened to have two functional copies of the SMN1 gene, however, due to limitations  of genetic screening the laboratory cannot confirm if Dana Castro's two functional copies are on the same chromosome or on opposite chromosomes. The laboratory identified the variant [id number] in Dana Castro's  screening, meaning there is increased risk for Dana Castro's two copies of the SMN1 gene to be on the same chromosome. Specifically, given Dana Castro's Hispanic ancestry, her risk to be a silent carrier is approximately 1 in 140. We reviewed with Dana Castro that if her two copies are on the same chromosome that means her other chromosome would have zero copies, and there could be risk for an affected pregnancy if her reproductive partner is found to be a carrier for SMA. Given Dana Castro's carrier screening result, prior testing was ordered by her OB for her partner, Dana Castro, for SMA. At this time, prior to testing, Dana Castro has a chance of 1 in 8 to be a carrier given his Hispanic ancestry and no known family history of SMA.  Overall, the chance for this couple to have a child with SMA at this time is 1/68X 1/140 X = 1 in 38,080. If he were to have testing that showed a low chance for him to be a carrier, this number would be decreased significantly.  If his testing were to show that he is a carrier, then the number would be increased.   We also talked about the possibility of testing at the time of delivery. All newborn babies in [place] are tested for SMA as well as numerous other conditions as part of state mandated newborn screening.   Family history: We obtained a detailed family history and pregnancy history.  Dana Castro reported one paternal first cousin with ADHD and possible autism that was treated with medication. These conditions are most likely the result of complex combinations of genetic and environmental or other factors. No genetic testing has been performed that Dana Castro is aware of.  We are happy to review medical records or offer additional testing if more details are learned about this history.  Dana Castro's father has been diagnosed with colon cancer.  It is not known his age at diagnosis or any details. We discussed that colon cancer may have strong genetic components in some families. If the family is concerned  about this history, we would suggest a consultation with a cancer genetic counselor. The remainder of the family history was unremarkable for birth defects, intellectual disabilities, muscle weakness conditions, early death or known genetic conditions.  Lyndzee reported that is her first pregnancy.  The patient reported no complications or exposures in this pregnancy that would be expected to increase the risk for birth defects.  Thank you for allowing [place] to be involved in the care of this patient.  We encouraged her to call with any questions or concerns as they arise.  We may be reached at [phone].  Plan of care: Dana Castro's testing is pending.  Once those results become available, we will speak in more detail about risks to this pregnancy.  In total, 60 minutes were spent on the date of the encounter in service to the patient including preparation, record review, virtual video consultation, documentation and care coordination.   Katrina Stack  I provided general supervision for this patient and was immediately available for any patient care concerns. [employer]

## 2023-05-30 ENCOUNTER — Encounter: Payer: Self-pay | Admitting: Family Medicine

## 2023-06-06 ENCOUNTER — Telehealth: Payer: Self-pay

## 2023-06-06 NOTE — Telephone Encounter (Signed)
 I called the patient's partner to discuss his carrier screening results ([name] Hoy Finlay DOB 08/07/1993). He was not found to be a carrier for spinal muscular atrophy. Please see report for details. A negative result on carrier screening reduces but does not eliminate the chance of being a carrier. The chance this couple's current and future pregnancies would be affected with SMA is very low.   Sheppard Plumber, MS, [facility] Certified Genetic Counselor [facility] [phone]

## 2023-06-13 DIAGNOSIS — Z77098 Contact with and (suspected) exposure to other hazardous, chiefly nonmedicinal, chemicals: Secondary | ICD-10-CM | POA: Insufficient documentation

## 2023-06-13 NOTE — Progress Notes (Unsigned)
   PRENATAL VISIT NOTE Centering Pregnancy Group 19, Session 2   Subjective:  Dana Castro is a 28 y.o. G2P0010 at [redacted]w[redacted]d being seen today for ongoing prenatal care.  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  Patient reports occasional contractions.  Contractions: Irregular (No pelvic pressure).  .  Movement: Present. Denies leaking of fluid.   The following portions of the patient's history were reviewed and updated as appropriate: allergies, current medications, past family history, past medical history, past social history, past surgical history and problem list.   Objective:   Vitals:   06/14/23 0935  BP: 115/74  Pulse: (!) 105  Weight: 153 lb 12.8 oz (69.8 kg)    Fetal Status: Fetal Heart Rate (bpm): 160 Fundal Height: 21 cm Movement: Present     General:  Alert, oriented and cooperative. Patient is in no acute distress.  Skin: Skin is warm and dry. No rash noted.   Cardiovascular: Normal heart rate noted  Respiratory: Normal respiratory effort, no problems with respiration noted  Abdomen: Soft, gravid, appropriate for gestational age.  Pain/Pressure: Absent     Pelvic: Cervical exam deferred        Extremities: Normal range of motion.     Mental Status: Normal mood and affect. Normal behavior. Normal judgment and thought content.   Assessment and Plan:  Pregnancy: G2P0010 at [redacted]w[redacted]d 1. Supervision of other normal pregnancy, antepartum (Primary)  Centering Pregnancy, Session#2: Reviewed rules for self-governance with group. Direct group to [employer].   Facilitated discussion today:  Nutrition, Back pain, Genetic screening options with AFP/Quad.   Mindfulness activity completed as well as deep breathing.  Also participated in mindful eating  activity. Completed healthy plate activity.  Fundal height and FHR appropriate today unless noted otherwise in plan. Patient to continue group care.   2. 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 [redacted] weeks gestation of pregnancy   4. Carrier of spinal muscular atrophy FOB carrier screening neg for SMA.  5. Cramping affecting pregnancy, antepartum - Wet prep, genital; Future   Preterm labor symptoms and general obstetric precautions including but not limited to vaginal bleeding, contractions, leaking of fluid and fetal movement were reviewed in detail with the patient. Please refer to After Visit Summary for other counseling recommendations.   Return in about 4 weeks (around 07/12/2023) for Centering, prenatal care.  Future Appointments  Date Time Provider Department Center  07/12/2023  9:00 AM CENTERING PROVIDER [facility]  07/26/2023  9:00 AM CENTERING PROVIDER [facility]  08/09/2023  9:00 AM CENTERING PROVIDER [facility]  08/23/2023  9:00 AM CENTERING PROVIDER [facility]  09/06/2023  9:00 AM CENTERING PROVIDER [facility]  09/20/2023  9:00 AM CENTERING PROVIDER [facility]  10/04/2023  9:00 AM CENTERING PROVIDER [facility]  10/18/2023  9:00 AM CENTERING PROVIDER [facility]    Wyn Forster, MD

## 2023-06-14 ENCOUNTER — Ambulatory Visit

## 2023-06-14 VITALS — BP 115/74 | HR 105 | Wt 153.8 lb

## 2023-06-14 DIAGNOSIS — Z3A23 23 weeks gestation of pregnancy: Secondary | ICD-10-CM | POA: Diagnosis not present

## 2023-06-14 DIAGNOSIS — O26892 Other specified pregnancy related conditions, second trimester: Secondary | ICD-10-CM

## 2023-06-14 DIAGNOSIS — O26899 Other specified pregnancy related conditions, unspecified trimester: Secondary | ICD-10-CM

## 2023-06-14 DIAGNOSIS — R109 Unspecified abdominal pain: Secondary | ICD-10-CM | POA: Diagnosis not present

## 2023-06-14 DIAGNOSIS — Z3480 Encounter for supervision of other normal pregnancy, unspecified trimester: Secondary | ICD-10-CM

## 2023-06-14 DIAGNOSIS — Z148 Genetic carrier of other disease: Secondary | ICD-10-CM

## 2023-06-14 DIAGNOSIS — Z77098 Contact with and (suspected) exposure to other hazardous, chiefly nonmedicinal, chemicals: Secondary | ICD-10-CM | POA: Diagnosis not present

## 2023-06-15 ENCOUNTER — Other Ambulatory Visit: Payer: Self-pay | Admitting: Obstetrics and Gynecology

## 2023-06-15 DIAGNOSIS — O24419 Gestational diabetes mellitus in pregnancy, unspecified control: Secondary | ICD-10-CM

## 2023-06-22 ENCOUNTER — Inpatient Hospital Stay (HOSPITAL_COMMUNITY)
Admission: AD | Admit: 2023-06-22 | Discharge: 2023-06-22 | Disposition: A | Discharge status: Home / Self Care | Attending: Obstetrics and Gynecology | Admitting: Obstetrics and Gynecology

## 2023-06-22 ENCOUNTER — Encounter (HOSPITAL_COMMUNITY): Payer: Self-pay | Admitting: Obstetrics and Gynecology

## 2023-06-22 DIAGNOSIS — R1032 Left lower quadrant pain: Secondary | ICD-10-CM | POA: Diagnosis not present

## 2023-06-22 DIAGNOSIS — Z3A24 24 weeks gestation of pregnancy: Secondary | ICD-10-CM | POA: Insufficient documentation

## 2023-06-22 DIAGNOSIS — N949 Unspecified condition associated with female genital organs and menstrual cycle: Secondary | ICD-10-CM

## 2023-06-22 DIAGNOSIS — O26892 Other specified pregnancy related conditions, second trimester: Secondary | ICD-10-CM | POA: Diagnosis not present

## 2023-06-22 HISTORY — DX: Other specified health status: Z78.9

## 2023-06-22 LAB — URINALYSIS, ROUTINE W REFLEX MICROSCOPIC
Bilirubin Urine: NEGATIVE
Glucose, UA: NEGATIVE mg/dL
Hgb urine dipstick: NEGATIVE
Ketones, ur: NEGATIVE mg/dL
Leukocytes,Ua: NEGATIVE
Nitrite: NEGATIVE
Protein, ur: NEGATIVE mg/dL
Specific Gravity, Urine: 1.016 (ref 1.005–1.030)
pH: 7 (ref 5.0–8.0)

## 2023-06-22 LAB — WET PREP, GENITAL
Clue Cells Wet Prep HPF POC: NONE SEEN
Sperm: NONE SEEN
Trich, Wet Prep: NONE SEEN
WBC, Wet Prep HPF POC: <10 (ref <10)
Yeast Wet Prep HPF POC: NONE SEEN

## 2023-06-22 MED ORDER — ACETAMINOPHEN 500 MG PO TABS
1000.0000 mg | ORAL_TABLET | Freq: Once | ORAL | Status: AC
  Administered 2023-06-22: 1000 mg via ORAL
  Filled 2023-06-22: qty 2

## 2023-06-22 NOTE — Discharge Instructions (Signed)
 It was a pleasure taking care of you today.  I think your pain is likely related to round ligament pain.  I recommend getting a pregnancy support band with this pain.  You could also use Tylenol for discomfort.  If your symptoms worsen or you have any other concerns please return for further evaluation.  Hope you have a wonderful day!

## 2023-06-22 NOTE — MAU Note (Signed)
.  Dana Castro is a 28 y.o. at [redacted]w[redacted]d here in MAU reporting:   Pt reports waking up with lower left side abdominal pain. That feels like pressure, come and goes. Has not taken anything for the pain.   No lof, no bleeding, FM+.   Pt states she was diagnosed with BV a couple of months ago and did not take her medicine. Pt states she is wondering if that is the reason why is she having this pain.   Vitals:   06/22/23 0659  BP: 111/66  Pulse: (!) 106  Resp: 18  Temp: 98.7 F (37.1 C)  SpO2: 97%    Pain score: 7/10 lower left side.   FHT: 145 Lab orders placed from triage: UA

## 2023-06-22 NOTE — MAU Provider Note (Addendum)
 None     S [name] is a 28 y.o. G2P0010 pregnant/non-pregnant female at [redacted]w[redacted]d who presents to MAU today with complaint of LLQ pain. She woke up to go to work and noticed a pressure-type pain in her left lower abdomen. It comes and goes, certain movements seem to be aggravating. She has not taken anything for pain. She is most concerned because she never started Flagyl for the BV she had about a month ago.  Receives care at [facility]. Prenatal records reviewed.  Pertinent items noted in HPI and remainder of comprehensive ROS otherwise negative.   O BP 111/66 (BP Location: Right Arm)   Pulse (!) 106   Temp 98.7 F (37.1 C) (Oral)   Resp 18   Ht 5' (1.524 m)   Wt 70.1 kg   LMP 01/01/2023   SpO2 97%   BMI 30.17 kg/m  Physical Exam Vitals reviewed.  Constitutional:      General: She is not in acute distress.    Appearance: She is well-developed. She is not diaphoretic.  Eyes:     General: No scleral icterus. Cardiovascular:     Rate and Rhythm: Normal rate and regular rhythm.     Heart sounds: No murmur heard.    No friction rub. No gallop.  Pulmonary:     Effort: Pulmonary effort is normal. No respiratory distress.  Abdominal:     Tenderness: There is abdominal tenderness in the left lower quadrant. There is no guarding or rebound.  Skin:    General: Skin is warm and dry.  Neurological:     Mental Status: She is alert.     Coordination: Coordination normal.    Results for orders placed or performed during the hospital encounter of 06/22/23 (from the past 24 hours)  Urinalysis, Routine w reflex microscopic -Urine, Clean Catch     Status: Abnormal   Collection Time: 06/22/23  7:01 AM  Result Value Ref Range   Color, Urine YELLOW YELLOW   APPearance CLOUDY (A) CLEAR   Specific Gravity, Urine 1.016 1.005 - 1.030   pH 7.0 5.0 - 8.0   Glucose, UA NEGATIVE NEGATIVE mg/dL   Hgb urine dipstick NEGATIVE NEGATIVE   Bilirubin Urine NEGATIVE NEGATIVE   Ketones, ur  NEGATIVE NEGATIVE mg/dL   Protein, ur NEGATIVE NEGATIVE mg/dL   Nitrite NEGATIVE NEGATIVE   Leukocytes,Ua NEGATIVE NEGATIVE  Wet prep, genital     Status: None   Collection Time: 06/22/23  8:12 AM   Specimen: Vaginal  Result Value Ref Range   Yeast Wet Prep HPF POC NONE SEEN NONE SEEN   Trich, Wet Prep NONE SEEN NONE SEEN   Clue Cells Wet Prep HPF POC NONE SEEN NONE SEEN   WBC, Wet Prep HPF POC <10 <10   Sperm NONE SEEN     MDM: Urinalysis Wet prep NST  MAU Course:  A Patient presenting with left lower quadrant pain.  Has been diagnosed with BV but never took the medication for it.  Was given other antibiotics for dental procedure.  Pressure comes and goes. Medical screening exam complete  P Discharge from MAU in stable condition with preterm labor precautions   Future Appointments  Date Time Provider Department Center  07/12/2023  9:00 AM CENTERING PROVIDER [facility]  07/26/2023  9:00 AM CENTERING PROVIDER [facility]  08/09/2023  9:00 AM CENTERING PROVIDER [facility]  08/23/2023  9:00 AM CENTERING PROVIDER [facility]  09/06/2023  9:00 AM CENTERING PROVIDER [facility]  09/20/2023  9:00 AM CENTERING PROVIDER [facility]  10/04/2023  9:00 AM CENTERING PROVIDER [facility]  10/18/2023  9:00 AM CENTERING PROVIDER [facility]   Allergies as of 06/22/2023   (No Known Allergies)   Noreene Bearded, PA 06/22/2023  Received hand from nighttime provider.  Differential including round ligament pain, constipation, BV/yeast infection.  Denies any bleeding or abnormal discharge.  Reports she did not complete her antibiotics for BV infection.  Wet prep today negative.  NST was reassuring.  On reevaluation patient reported improvement of the pain with the Tylenol.  Likely round ligament pain.  Discussed strict return precautions.  Recommended pregnancy support belt and Tylenol as needed.  No further questions or concerns.  Patient discharged home.   Emrick Hensch V, MD 06/22/2023 8:56  AM

## 2023-06-23 LAB — WET PREP, GENITAL

## 2023-06-26 ENCOUNTER — Encounter: Payer: Self-pay | Admitting: *Deleted

## 2023-07-07 LAB — WET PREP, GENITAL

## 2023-07-12 ENCOUNTER — Other Ambulatory Visit: Payer: Self-pay

## 2023-07-12 ENCOUNTER — Ambulatory Visit: Payer: Self-pay | Admitting: Obstetrics and Gynecology

## 2023-07-12 VITALS — BP 110/74 | HR 123 | Wt 155.4 lb

## 2023-07-12 DIAGNOSIS — Z148 Genetic carrier of other disease: Secondary | ICD-10-CM | POA: Diagnosis not present

## 2023-07-12 DIAGNOSIS — Z1332 Encounter for screening for maternal depression: Secondary | ICD-10-CM | POA: Diagnosis not present

## 2023-07-12 DIAGNOSIS — Z3A27 27 weeks gestation of pregnancy: Secondary | ICD-10-CM | POA: Insufficient documentation

## 2023-07-12 DIAGNOSIS — Z3480 Encounter for supervision of other normal pregnancy, unspecified trimester: Secondary | ICD-10-CM | POA: Diagnosis not present

## 2023-07-12 NOTE — Progress Notes (Signed)
   PRENATAL VISIT NOTE Centering Pregnancy Group 19, Session 3   Subjective:  Dana Castro is a 28 y.o. G2P0010 at [redacted]w[redacted]d being seen today for ongoing prenatal care.  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  Patient reports no complaints.  Contractions: Not present.  .  Movement: Present. Denies leaking of fluid.   The following portions of the patient's history were reviewed and updated as appropriate: allergies, current medications, past family history, past medical history, past social history, past surgical history and problem list.   Objective:   Vitals:   07/12/23 0924  BP: 110/74  Pulse: (!) 123  Weight: 155 lb 6.4 oz (70.5 kg)    Fetal Status: Fetal Heart Rate (bpm): 140 Fundal Height: 29 cm Movement: Present     General:  Alert, oriented and cooperative. Patient is in no acute distress.  Skin: Skin is warm and dry. No rash noted.   Cardiovascular: Normal heart rate noted  Respiratory: Normal respiratory effort, no problems with respiration noted  Abdomen: Soft, gravid, appropriate for gestational age.  Pain/Pressure: Absent     Pelvic: Cervical exam deferred        Extremities: Normal range of motion.     Mental Status: Normal mood and affect. Normal behavior. Normal judgment and thought content.   Assessment and Plan:  Pregnancy: G2P0010 at [redacted]w[redacted]d 1. [redacted] weeks gestation of pregnancy 2. Supervision of other normal pregnancy, antepartum  Centering Pregnancy, Session#3: Reviewed resources in [employer].   Facilitated discussion today:  Stress/Stress reduction andback pain and exercise in pregnancy Mindfulness activity completed as well as deep breathing with still touch for childbirth preparation.    Fundal height and FHR appropriate today  unless noted otherwise in plan. Patient to continue group care.    OGTT & 3rd trimester labs -  to be scheduled at check out  3. Carrier of spinal muscular atrophy (Primary) FOB carrier screening neg for SMA.   Preterm labor symptoms and general obstetric precautions including but not limited to vaginal bleeding, contractions, leaking of fluid and fetal movement were reviewed in detail with the patient. Please refer to After Visit Summary for other counseling recommendations.   No follow-ups on file.  Future Appointments  Date Time Provider Department Center  07/26/2023  9:00 AM CENTERING PROVIDER [facility]  08/09/2023  9:00 AM CENTERING PROVIDER [facility]  08/23/2023  9:00 AM CENTERING PROVIDER [facility]  09/06/2023  9:00 AM CENTERING PROVIDER [facility]  09/20/2023  9:00 AM CENTERING PROVIDER [facility]  10/04/2023  9:00 AM CENTERING PROVIDER [facility]  10/18/2023  9:00 AM CENTERING PROVIDER [facility]    Darrow End, MD

## 2023-07-12 NOTE — Addendum Note (Signed)
 Addended by: Devorah Fonder T on: 07/12/2023 11:55 AM   Modules accepted: Orders

## 2023-07-26 ENCOUNTER — Ambulatory Visit: Payer: Self-pay | Admitting: Obstetrics and Gynecology

## 2023-07-26 ENCOUNTER — Other Ambulatory Visit: Payer: Self-pay

## 2023-07-26 VITALS — BP 111/64 | HR 109 | Wt 159.2 lb

## 2023-07-26 DIAGNOSIS — Z3A29 29 weeks gestation of pregnancy: Secondary | ICD-10-CM

## 2023-07-26 DIAGNOSIS — Z3483 Encounter for supervision of other normal pregnancy, third trimester: Secondary | ICD-10-CM | POA: Diagnosis not present

## 2023-07-26 DIAGNOSIS — Z3A27 27 weeks gestation of pregnancy: Secondary | ICD-10-CM | POA: Diagnosis not present

## 2023-07-26 DIAGNOSIS — Z3480 Encounter for supervision of other normal pregnancy, unspecified trimester: Secondary | ICD-10-CM

## 2023-07-26 DIAGNOSIS — Z148 Genetic carrier of other disease: Secondary | ICD-10-CM

## 2023-07-26 NOTE — Progress Notes (Signed)
   PRENATAL VISIT NOTE Centering Pregnancy Group 19, Session 4  Subjective:  Dana Castro is a 28 y.o. G2P0010 at [redacted]w[redacted]d being seen today for ongoing prenatal care.  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  Patient reports backache.  Contractions: Not present.  .  Movement: Present. Denies leaking of fluid.   The following portions of the patient's history were reviewed and updated as appropriate: allergies, current medications, past family history, past medical history, past social history, past surgical history and problem list.   Objective:    Vitals:   07/26/23 0910  BP: 111/64  Pulse: (!) 109  Weight: 159 lb 3.2 oz (72.2 kg)    Fetal Status:  Fetal Heart Rate (bpm): 154 Fundal Height: 31 cm Movement: Present    General: Alert, oriented and cooperative. Patient is in no acute distress.  Skin: Skin is warm and dry. No rash noted.   Cardiovascular: Normal heart rate noted  Respiratory: Normal respiratory effort, no problems with respiration noted  Abdomen: Soft, gravid, appropriate for gestational age.  Pain/Pressure: Absent     Pelvic: Cervical exam deferred        Extremities: Normal range of motion.     Mental Status: Normal mood and affect. Normal behavior. Normal judgment and thought content.   Assessment and Plan:  Pregnancy: G2P0010 at [redacted]w[redacted]d 1. [redacted] weeks gestation of pregnancy (Primary) 2. Supervision of other normal pregnancy, antepartum  Centering Pregnancy, Session#4: Reviewed resources in [employer].   Facilitated discussion today:  Family planning postpartum, and preterm labor Mindfulness activity completed as well as deep breathing with hand massage for childbirth preparation.    Fundal height and FHR appropriate today unless noted  otherwise in plan. Patient to continue group care.    3rd TM labs and OGTT completed today, results pending  3. Carrier of spinal muscular atrophy FOB carrier screening neg for SMA  Preterm labor symptoms and general obstetric precautions including but not limited to vaginal bleeding, contractions, leaking of fluid and fetal movement were reviewed in detail with the patient. Please refer to After Visit Summary for other counseling recommendations.   No follow-ups on file.  Future Appointments  Date Time Provider Department Center  08/09/2023  9:00 AM CENTERING PROVIDER [facility]  08/23/2023  9:00 AM CENTERING PROVIDER [facility]  09/06/2023  9:00 AM CENTERING PROVIDER [facility]  09/20/2023  9:00 AM CENTERING PROVIDER [facility]  10/04/2023  9:00 AM CENTERING PROVIDER [facility]  10/18/2023  9:00 AM CENTERING PROVIDER [facility]    Darrow End, MD

## 2023-07-27 LAB — CBC
Hematocrit: 37.7 % (ref 34.0–46.6)
Hemoglobin: 12.7 g/dL (ref 11.1–15.9)
MCH: 29.5 pg (ref 26.6–33.0)
MCHC: 33.7 g/dL (ref 31.5–35.7)
MCV: 88 fL (ref 79–97)
Platelets: 368 10*3/uL (ref 150–450)
RBC: 4.31 x10E6/uL (ref 3.77–5.28)
RDW: 12 % (ref 11.7–15.4)
WBC: 9.9 10*3/uL (ref 3.4–10.8)

## 2023-07-27 LAB — GLUCOSE TOLERANCE, 2 HOURS W/ 1HR
Glucose, 1 hour: 189 mg/dL — ABNORMAL HIGH (ref 70–179)
Glucose, 2 hour: 145 mg/dL (ref 70–152)
Glucose, Fasting: 84 mg/dL (ref 70–91)

## 2023-07-27 LAB — RPR: RPR Ser Ql: NONREACTIVE

## 2023-08-07 ENCOUNTER — Ambulatory Visit: Payer: Self-pay | Admitting: Obstetrics and Gynecology

## 2023-08-07 MED ORDER — BLOOD GLUCOSE MONITORING SUPPL DEVI: 1.0000 | Freq: Three times a day (TID) | 0 refills | Status: DC

## 2023-08-07 MED ORDER — LANCETS MISC. MISC: 1.0000 | Freq: Three times a day (TID) | 0 refills | Status: AC

## 2023-08-07 MED ORDER — LANCET DEVICE MISC: 1.0000 | Freq: Three times a day (TID) | 0 refills | Status: AC

## 2023-08-07 MED ORDER — BLOOD GLUCOSE TEST VI STRP
1.0000 | ORAL_STRIP | Freq: Three times a day (TID) | 0 refills | Status: AC
Start: 2023-08-07 — End: 2023-09-06

## 2023-08-07 NOTE — Progress Notes (Addendum)
 Attempted call to to patient 08/07/2023 @0838  - reached unidentified voicemail.   MyChart message sent [phone].   INDICATION Growth U/S Antenatal testing (weekly unless specified) DELIVERY RECOMMENDATION (GA)  Diabetes   A1 - good glycemic control     A1 - good glycemic control w/ LGA and/or polyhydramnioos      A2 - good glycemic control     A2  - poor glycemic control or poor compliance    (LGA, polyhydramnios, abnormal glucose)   Q4-6w @ 28w  Q4-6w @ 28w  Q4-6w @ 28w  Q4w @ 24w    None  32w  32w  28-32w per MFM   39.0-39.6w  38.0-39.0w  39-39.6w  36-38.0w or per MFM       Testing supplies ordered.  Recommended glucose goals:  - fasting blood glucose less than 95 mg/dL - one-hour postprandial blood glucose less than 140 mg/dL - two-hour postprandial blood glucose less than 120 mg/dL   Diet Recommendations for Diabetes  Carbohydrate includes starch, sugar, and fiber.  Of these, only sugar and starch raise blood glucose.  (Fiber is found in fruits, vegetables [especially skin, seeds, and stalks], whole grains, and beans.)   Starchy (carb) foods: Bread, rice, pasta, white or sweet potatoes, corn, cereal, grits, crackers, bagels, muffins, all baked goods.  (Fruit, milk, and yogurt also have carbohydrate, but most of these foods will not spike your blood sugar as much as most starchy or sweet foods will.)  A few fruits do cause high blood sugars; use small portions of grapes, watermelon, oranges, and bananas (limit to 1/2 at a time).   Protein foods: Meat, fish, poultry, eggs, dairy foods, and beans such as pinto and kidney beans (beans also provide carbohydrate).   1. Eat at least 3 REAL meals and 1-2 snacks per day.  Eat breakfast within one hour of getting up.  Eat something at least every 5 hours while awake.  - A REAL breakfast needs to include both starch and protein foods.   - A REAL meal for lunch or dinner includes at least some protein, some starch, and  vegetables and/or fruit.     2. Limit starchy foods to TWO portions per meal and ONE per snack. ONE portion of a starchy food is equal to the following:  - ONE slice of bread (or its equivalent, such as half of a hamburger bun).  - 1/2 cup of a "scoopable" starchy food such as potatoes or rice.  - 15 grams of Total Carbohydrate as shown on food label.  - 4 ounces of a sweet drink (including fruit juice).  3. Include vegetables in at least 10 meals per week.  - Fresh or frozen vegetables are best.  - Keep frozen vegetables on hand for a quick option.

## 2023-08-07 NOTE — Addendum Note (Signed)
 Addended by: Afsana Liera R on: 08/07/2023 08:49 AM   Modules accepted: Orders, Level of Service

## 2023-08-09 ENCOUNTER — Ambulatory Visit: Payer: Self-pay | Admitting: Obstetrics and Gynecology

## 2023-08-09 VITALS — BP 117/83 | HR 106 | Wt 160.4 lb

## 2023-08-09 DIAGNOSIS — Z3A31 31 weeks gestation of pregnancy: Secondary | ICD-10-CM

## 2023-08-09 DIAGNOSIS — Z8632 Personal history of gestational diabetes: Secondary | ICD-10-CM | POA: Insufficient documentation

## 2023-08-09 DIAGNOSIS — Z3A33 33 weeks gestation of pregnancy: Secondary | ICD-10-CM | POA: Insufficient documentation

## 2023-08-09 DIAGNOSIS — O24419 Gestational diabetes mellitus in pregnancy, unspecified control: Secondary | ICD-10-CM | POA: Diagnosis not present

## 2023-08-09 DIAGNOSIS — Z3480 Encounter for supervision of other normal pregnancy, unspecified trimester: Secondary | ICD-10-CM

## 2023-08-09 NOTE — Progress Notes (Signed)
   PRENATAL VISIT NOTE Centering Pregnancy Group 19, Session 5   Subjective:  Dana Castro is a 28 y.o. G2P0010 at [redacted]w[redacted]d being seen today for ongoing prenatal care.  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  Patient reports lower abdominal pressure and intermittent cramping.  Contractions: Not present. Vag. Bleeding: None.  Movement: Present. Denies leaking of fluid.   The following portions of the patient's history were reviewed and updated as appropriate: allergies, current medications, past family history, past medical history, past social history, past surgical history and problem list.   Objective:    Vitals:   08/09/23 0929  BP: 117/83  Pulse: (!) 106  Weight: 160 lb 6.4 oz (72.8 kg)    Fetal Status:  Fetal Heart Rate (bpm): 136 Fundal Height: 29 cm Movement: Present    General: Alert, oriented and cooperative. Patient is in no acute distress.  Skin: Skin is warm and dry. No rash noted.   Cardiovascular: Normal heart rate noted  Respiratory: Normal respiratory effort, no problems with respiration noted  Abdomen: Soft, gravid, appropriate for gestational age.  Pain/Pressure: Present     Pelvic: Cervical exam deferred        Extremities: Normal range of motion.  Edema: None  Mental Status: Normal mood and affect. Normal behavior. Normal judgment and thought content.   Assessment and Plan:  Pregnancy: G2P0010 at [redacted]w[redacted]d 1. [redacted] weeks gestation of pregnancy (Primary) 2. Supervision of other normal pregnancy, antepartum  Centering Pregnancy, Session#5: Reviewed resources in [employer].   Facilitated discussion today:  Stages of Labor, Sign of labor, labor positions, coping strategies.    Fundal height and FHR appropriate today unless noted otherwise in plan. Patient to continue group care.    3. Gestational diabetes mellitus (GDM) affecting first pregnancy Discussed diagnosis Education about glucose goals  Growth US  ordered, will schedule at check out Monitoring supplies ordered Diabetes education referral placed, and scheduled initial visit  Preterm labor symptoms and general obstetric precautions including but not limited to vaginal bleeding, contractions, leaking of fluid and fetal movement were reviewed in detail with the patient. Please refer to After Visit Summary for other counseling recommendations.   Return in about 2 weeks (around 08/23/2023) for Routine prenatal care, Centering Pregnancy.  Future Appointments  Date Time Provider Department Center  08/22/2023 11:15 AM [facility]  08/23/2023  9:00 AM CENTERING PROVIDER [facility]  09/06/2023  9:00 AM CENTERING PROVIDER [facility]  09/20/2023  9:00 AM CENTERING PROVIDER [facility]  10/04/2023  9:00 AM CENTERING PROVIDER [facility]  10/18/2023  9:00 AM CENTERING PROVIDER [facility]    Abner Ables, MD

## 2023-08-09 NOTE — Patient Instructions (Signed)
 Blood sugar goals: - fasting less than 90-95 mg/dL, - one hour after meals less than 140 mg/dL - OR two hours after meals less than 120 mg/dL.

## 2023-08-16 ENCOUNTER — Telehealth: Payer: Self-pay

## 2023-08-16 NOTE — Telephone Encounter (Signed)
-----   Message from Abner Ables sent at 08/15/2023 10:19 PM EDT ----- Can you help schedule Dana Castro for her growth US -- needed in about 3 weeks?

## 2023-08-16 NOTE — Telephone Encounter (Signed)
 Called patient for preferred time and days for growth US  needed. Informed patient I would call and get US  schedule and it will show up in her MyChart shortly. US  scheduled for 09/06/2023 @1 :00pm. Patient verbalized understanding.   B'Aisha, RMA

## 2023-08-22 ENCOUNTER — Other Ambulatory Visit: Payer: Self-pay

## 2023-08-22 ENCOUNTER — Ambulatory Visit: Admitting: Dietician

## 2023-08-22 ENCOUNTER — Encounter: Attending: Pediatrics | Admitting: Dietician

## 2023-08-22 DIAGNOSIS — Z3A33 33 weeks gestation of pregnancy: Secondary | ICD-10-CM

## 2023-08-22 DIAGNOSIS — O24419 Gestational diabetes mellitus in pregnancy, unspecified control: Secondary | ICD-10-CM | POA: Diagnosis not present

## 2023-08-22 DIAGNOSIS — Z713 Dietary counseling and surveillance: Secondary | ICD-10-CM | POA: Diagnosis not present

## 2023-08-22 NOTE — Progress Notes (Signed)
 Patient was seen for Gestational Diabetes/Pre-existing Diabetes During Pregnancy) on 08/22/2023  Start time 1016 and End time 1205   Estimated due date: 10/08/2023; [redacted]w[redacted]d  Clinical: Medications: prenatal vitamin Medical History: GDM Labs: OGTT fasting 84, 1 hour 189, 2 hour 145 on 07/26/2023, A1c 5.4% on 05/09/2023       Dietary and Lifestyle History: Patient lives with her parents.  Her mom does most of the shopping and cooking.  She is working at a [employer] (currently on light duty) - first shift.  Physical Activity: tries to walk about 15-30 minutes most days Stress: high Sleep: poor (4-5 hours plus occasional 2 hour nap)  24 hr Recall:  First Meal:  protein shake Snack:  apple, banana Second meal:  tacos, chips Snack:  none Third meal:  cereal (Honey bunches of oats) and 2% milk Snack:  none Beverages:  water, occasional 2% milk, occasional juice  NUTRITION INTERVENTION  Nutrition education (E-1) on the following topics:   Initial Follow-up  [x]  []  Definition of Gestational Diabetes [x]  []  Why dietary management is important in controlling blood glucose [x]  []  Effects each nutrient has on blood glucose levels [x]  []  Simple carbohydrates vs complex carbohydrates [x]  []  Fluid intake [x]  []  Creating a balanced meal plan [x]  []  Carbohydrate counting  [x]  []  When to check blood glucose levels [x]  []  Proper blood glucose monitoring techniques [x]  []  Effect of stress and stress reduction techniques  [x]  []  Exercise effect on blood glucose levels, appropriate exercise during pregnancy [x]  []  Importance of limiting caffeine and abstaining from alcohol and smoking [x]  []  Medications used for blood sugar control during pregnancy [x]  []  Hypoglycemia and rule of 15 [x]  []  Postpartum self care   Accu Chek Guide. Patient has a meter prior to visit. Patient is testing but only about 3 times per week.  She did not bring her log and does not remember what her reading  was. Postprandial: 66 in office today  Patient instructed to monitor glucose levels: FBS: 60 - <= 95 mg/dL; 2 hour: <= 161 mg/dL  Patient received handouts: Nutrition Diabetes and Pregnancy Carbohydrate Counting List Blood glucose log Snack ideas for diabetes during pregnancy  Patient will be seen for follow-up as needed.

## 2023-08-23 ENCOUNTER — Ambulatory Visit: Payer: Self-pay

## 2023-08-23 VITALS — BP 115/71 | HR 101 | Wt 164.0 lb

## 2023-08-23 DIAGNOSIS — Z3A33 33 weeks gestation of pregnancy: Secondary | ICD-10-CM

## 2023-08-23 DIAGNOSIS — O24410 Gestational diabetes mellitus in pregnancy, diet controlled: Secondary | ICD-10-CM | POA: Diagnosis not present

## 2023-08-23 DIAGNOSIS — Z148 Genetic carrier of other disease: Secondary | ICD-10-CM

## 2023-08-23 DIAGNOSIS — Z3480 Encounter for supervision of other normal pregnancy, unspecified trimester: Secondary | ICD-10-CM

## 2023-08-23 NOTE — Progress Notes (Signed)
 PRENATAL VISIT NOTE Centering Pregnancy Group 19, Session 6  Subjective:  Dana Castro is a 28 y.o. G2P0010 at [redacted]w[redacted]d being seen today for ongoing prenatal care.  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  Patient reports no complaints.  Contractions: Not present.  .  Movement: Present. Denies leaking of fluid.   The following portions of the patient's history were reviewed and updated as appropriate: allergies, current medications, past family history, past medical history, past social history, past surgical history and problem list.   Objective:    Vitals:   08/23/23 0911  BP: 115/71  Pulse: (!) 101  Weight: 164 lb (74.4 kg)    Fetal Status:  Fetal Heart Rate (bpm): 155 Fundal Height: 33 cm Movement: Present Presentation: Vertex  General: Alert, oriented and cooperative. Patient is in no acute distress.  Skin: Skin is warm and dry. No rash noted.   Cardiovascular: Normal heart rate noted  Respiratory: Normal respiratory effort, no problems with respiration noted  Abdomen: Soft, gravid, appropriate for gestational age.  Pain/Pressure: Absent     Pelvic: Cervical exam deferred        Extremities: Normal range of motion.     Mental Status: Normal mood and affect. Normal behavior. Normal judgment and thought content.   Assessment and Plan:  Pregnancy: G2P0010 at [redacted]w[redacted]d 1. Supervision of other normal pregnancy, antepartum (Primary)  Centering Pregnancy, Session#6: Reviewed resources in [employer].  Facilitated discussion today: family planning/reproductive life planning, coping strategies   Mindfulness activity with positive affirmations   Fundal height and FHR appropriate today unless noted otherwise in plan. Patient to  continue group care.   2. Carrier of spinal muscular atrophy  3. Diet controlled gestational diabetes mellitus (GDM) in third trimester Diabetes   A1 - good glycemic control     A1 - good glycemic control w/ LGA and/or polyhydramnioos      A2 - good glycemic control     A2  - poor glycemic control or poor compliance    (LGA, polyhydramnios, abnormal glucose)     Pregestational Type  I or II DM - good glycemic control    Pregestational Type  I or II DM - poor glycemic control or other comorbidities  Q4-6w @ 28w  Q4-6w @ 28w  Q4-6w @ 28w  Q4w @ 24w   Q4w @ 24w  Q4w @ 24w  None  32w  32w  28-32w per MFM   32w  28w - weekly 32w - 2x/wk  39.0-39.6w  38.0-39.0w  39-39.6w  36-38.0w or per MFM    39-39.6w  36-37.0w or earlier as per MFM   Met with diabetes educator.  Fasting 66 > 97 PP 2hr 122 Continue to test and bring log to follow up appointments Growth scheduled 7/2   Preterm labor symptoms and general obstetric precautions including but not limited to vaginal bleeding, contractions, leaking of fluid and fetal movement were reviewed in detail with the patient. Please refer to After Visit Summary for other counseling recommendations.   No follow-ups on file.  Future Appointments  Date Time Provider Department Center  09/06/2023  9:00 AM CENTERING PROVIDER [facility]  09/06/2023  1:00 PM [facility] PROVIDER 1 [facility]  09/06/2023  1:30 PM [facility] US[facility]  09/20/2023  9:00  AM CENTERING PROVIDER [facility]  10/04/2023  9:00 AM CENTERING PROVIDER [facility]  10/18/2023  9:00 AM CENTERING PROVIDER [facility]    Darrow End, MD

## 2023-09-06 ENCOUNTER — Ambulatory Visit: Payer: Self-pay | Admitting: Obstetrics and Gynecology

## 2023-09-06 ENCOUNTER — Ambulatory Visit

## 2023-09-06 ENCOUNTER — Other Ambulatory Visit: Payer: Self-pay | Admitting: Obstetrics and Gynecology

## 2023-09-06 ENCOUNTER — Ambulatory Visit: Attending: Obstetrics and Gynecology | Admitting: Obstetrics and Gynecology

## 2023-09-06 VITALS — BP 123/73 | HR 109 | Wt 167.2 lb

## 2023-09-06 VITALS — BP 139/80 | HR 94

## 2023-09-06 DIAGNOSIS — Z3A35 35 weeks gestation of pregnancy: Secondary | ICD-10-CM | POA: Diagnosis not present

## 2023-09-06 DIAGNOSIS — Z3A27 27 weeks gestation of pregnancy: Secondary | ICD-10-CM | POA: Diagnosis not present

## 2023-09-06 DIAGNOSIS — O24410 Gestational diabetes mellitus in pregnancy, diet controlled: Secondary | ICD-10-CM

## 2023-09-06 DIAGNOSIS — O368120 Decreased fetal movements, second trimester, not applicable or unspecified: Secondary | ICD-10-CM | POA: Diagnosis not present

## 2023-09-06 DIAGNOSIS — Z148 Genetic carrier of other disease: Secondary | ICD-10-CM

## 2023-09-06 DIAGNOSIS — O368130 Decreased fetal movements, third trimester, not applicable or unspecified: Secondary | ICD-10-CM

## 2023-09-06 DIAGNOSIS — Z3480 Encounter for supervision of other normal pregnancy, unspecified trimester: Secondary | ICD-10-CM

## 2023-09-06 NOTE — Progress Notes (Signed)
 Maternal-Fetal Medicine Consultation Name: Dana Castro MRN: [id number]  G2 P0010 at 35w 3d gestation.  Patient is here for fetal growth assessment. She has a diagnosis of gestational diabetes.  She checks her blood glucose regularly and reports that both fasting and postprandial levels are within normal range. Blood pressure today at our office is 139/80 mmHg. She reported decreased fetal movements today.  Ultrasound Fetal growth is appropriate for gestational age.  Amniotic fluid is normal good fetal activity seen.  Cephalic presentation.  Antenatal testing is reassuring.  BPP 8/8. Patient later perceived good fetal movements.  I reassured the patient of the findings.   Gestational diabetes I explained the diagnosis of gestational diabetes.  I emphasized the importance of good blood glucose control to prevent adverse fetal or neonatal outcomes.  I discussed normal range of blood glucose values. I encouraged her to check her blood glucose regularly.  Possible complications of gestational diabetes include fetal macrosomia, shoulder dystocia and birth injuries, stillbirth (in poorly controlled diabetes) and neonatal respiratory syndrome and other complications.  In about 85% of cases, gestational diabetes is well controlled by diet alone.  Exercise reduces the need for insulin.  Medical treatment includes oral hypoglycemics or insulin.  Timing of delivery: In well-controlled diabetes on diet, patient can be delivered at 40-weeks' gestation. Vaginal delivery can be safely attempted. Type 2 diabetes develops in up to 50% of women with GDM. I recommend postpartum screening with 75-g glucose load at 6 to 12 weeks after delivery.  Recommendations - Follow-up scans as clinically indicated.  Consultation including face-to-face (more than 50%) counseling 20 minutes.

## 2023-09-06 NOTE — Progress Notes (Signed)
 PRENATAL VISIT NOTE Centering Pregnancy Group 19, Session 7   Subjective:  Dana Castro is a 28 y.o. G2P0010 at 102w3d being seen today for ongoing prenatal care.  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  Patient reports no complaints.  Contractions: Irritability.  .  Movement: Present. Denies leaking of fluid.   The following portions of the patient's history were reviewed and updated as appropriate: allergies, current medications, past family history, past medical history, past social history, past surgical history and problem list.   Objective:    Vitals:   09/06/23 0926  BP: 123/73  Pulse: (!) 109  Weight: 167 lb 3.2 oz (75.8 kg)    Fetal Status:  Fetal Heart Rate (bpm): 136 Fundal Height: 33 cm Movement: Present Presentation: Vertex  General: Alert, oriented and cooperative. Patient is in no acute distress.  Skin: Skin is warm and dry. No rash noted.   Cardiovascular: Normal heart rate noted  Respiratory: Normal respiratory effort, no problems with respiration noted  Abdomen: Soft, gravid, appropriate for gestational age.  Pain/Pressure: Present     Pelvic: Cervical exam deferred        Extremities: Normal range of motion.     Mental Status: Normal mood and affect. Normal behavior. Normal judgment and thought content.   Assessment and Plan:  Pregnancy: G2P0010 at [redacted]w[redacted]d 1. [redacted] weeks gestation of pregnancy (Primary) 2. Supervision of other normal pregnancy, antepartum  Centering Pregnancy, Session#7: Reviewed resources in [employer].   Facilitated discussion today: Breast feeding, postpartum communication and postpartum mood changes, Mindfulness activity with mindful listening  Fundal height and FHR appropriate today  unless noted otherwise in plan. Patient to continue group care.    3. Diet controlled gestational diabetes mellitus (GDM) in third trimester Diabetes   A1 - good glycemic control      A1 - good glycemic control w/ LGA and/or polyhydramnioos      A2 - good glycemic control      A2  - poor glycemic control or poor compliance    (LGA, polyhydramnios, abnormal glucose)      Pregestational Type  I or II DM - good glycemic control     Pregestational Type  I or II DM - poor glycemic control or other comorbidities   Q4-6w @ 28w   Q4-6w @ 28w   Q4-6w @ 28w   Q4w @ 24w     Q4w @ 24w   Q4w @ 24w   None   32w   32w   28-32w per MFM     32w   28w - weekly 32w - 2x/wk   39.0-39.6w   38.0-39.0w   39-39.6w   36-38.0w or per MFM      39-39.6w   36-37.0w or earlier as per MFM    Met with diabetes educator.  Fasting 70s PP 2hr 120s Continue to test and bring log to follow up appointments Growth scheduled today  4. Carrier of spinal muscular atrophy  Preterm labor symptoms and general obstetric precautions including but not limited to vaginal bleeding, contractions, leaking of fluid and fetal movement were reviewed in detail with the patient. Please refer to After Visit Summary for other counseling recommendations.   Return in about 1 week (around 09/13/2023) for weekly visit til delivery between  centering.  Future Appointments  Date Time Provider Department Center  09/06/2023  1:00 PM [facility] PROVIDER 1 [facility]  09/06/2023  1:30 PM [facility] US7 [facility]  09/20/2023  9:00 AM CENTERING PROVIDER [facility]  10/04/2023  9:00 AM CENTERING PROVIDER [facility]  10/18/2023  9:00 AM CENTERING PROVIDER [facility]    Mardy Shropshire, MD

## 2023-09-20 ENCOUNTER — Ambulatory Visit (INDEPENDENT_AMBULATORY_CARE_PROVIDER_SITE_OTHER): Payer: Self-pay | Admitting: Obstetrics and Gynecology

## 2023-09-20 ENCOUNTER — Other Ambulatory Visit (HOSPITAL_COMMUNITY)
Admission: RE | Admit: 2023-09-20 | Discharge: 2023-09-20 | Disposition: A | Discharge status: Home / Self Care | Source: Ambulatory Visit | Attending: Obstetrics and Gynecology | Admitting: Obstetrics and Gynecology

## 2023-09-20 ENCOUNTER — Other Ambulatory Visit: Payer: Self-pay

## 2023-09-20 VITALS — BP 117/68 | HR 118 | Wt 170.6 lb

## 2023-09-20 DIAGNOSIS — O24410 Gestational diabetes mellitus in pregnancy, diet controlled: Secondary | ICD-10-CM | POA: Diagnosis not present

## 2023-09-20 DIAGNOSIS — Z3480 Encounter for supervision of other normal pregnancy, unspecified trimester: Secondary | ICD-10-CM

## 2023-09-20 DIAGNOSIS — Z3A37 37 weeks gestation of pregnancy: Secondary | ICD-10-CM | POA: Insufficient documentation

## 2023-09-20 DIAGNOSIS — Z148 Genetic carrier of other disease: Secondary | ICD-10-CM

## 2023-09-20 DIAGNOSIS — Z3483 Encounter for supervision of other normal pregnancy, third trimester: Secondary | ICD-10-CM | POA: Diagnosis not present

## 2023-09-20 DIAGNOSIS — Z1331 Encounter for screening for depression: Secondary | ICD-10-CM

## 2023-09-20 DIAGNOSIS — Z3493 Encounter for supervision of normal pregnancy, unspecified, third trimester: Secondary | ICD-10-CM | POA: Diagnosis not present

## 2023-09-20 NOTE — Progress Notes (Signed)
   PRENATAL VISIT NOTE Centering Pregnancy Group 19, Session 8  Subjective:  Dana Castro is a 28 y.o. G2P0010 at [redacted]w[redacted]d being seen today for ongoing prenatal care.  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  Patient reports generalized discomforts.  Contractions: Not present.  .  Movement: Present. Denies leaking of fluid.   The following portions of the patient's history were reviewed and updated as appropriate: allergies, current medications, past family history, past medical history, past social history, past surgical history and problem list.   Objective:    There were no vitals filed for this visit.  Fetal Status:  Fetal Heart Rate (bpm): 158 Fundal Height: 37 cm Movement: Present Presentation: Vertex  General: Alert, oriented and cooperative. Patient is in no acute distress.  Skin: Skin is warm and dry. No rash noted.   Cardiovascular: Normal heart rate noted  Respiratory: Normal respiratory effort, no problems with respiration noted  Abdomen: Soft, gravid, appropriate for gestational age.  Pain/Pressure: Absent     Pelvic: Cervical exam deferred        Extremities: Normal range of motion.     Mental Status: Normal mood and affect. Normal behavior. Normal judgment and thought content.   Assessment and Plan:  Pregnancy: G2P0010 at [redacted]w[redacted]d 1. [redacted] weeks gestation of pregnancy (Primary)   Centering Pregnancy, Session#8: Reviewed resources in [employer].   Facilitated discussion today:  Newborn safety, delivery planning, postpartum planning and follow other topics (family planning, breastfeeding)  Fundal height and FHR appropriate today unless noted otherwise in plan. Patient to continue group care.    Offered 8/3 IOL if no  spontaneous labor prior, pt to consider preference for midnight or AM.  2. Diet controlled gestational diabetes mellitus (GDM) in third trimester Fasting 72-75 PP 105-120 Est. FW:    2586  gm    5 lb 11 oz      38%, AC 88%  3. Carrier of spinal muscular atrophy FOB carrier screening neg for SMA.  Term labor symptoms and general obstetric precautions including but not limited to vaginal bleeding, contractions, leaking of fluid and fetal movement were reviewed in detail with the patient. Please refer to After Visit Summary for other counseling recommendations.   No follow-ups on file.  Future Appointments  Date Time Provider Department Center  09/28/2023  4:15 PM Izell Harari, MD [facility]  10/04/2023  9:00 AM CENTERING PROVIDER [facility]  10/11/2023 10:55 AM Anyanwu, Gloris LABOR, MD [facility]  10/18/2023  9:00 AM CENTERING PROVIDER [facility]    Mardy Shropshire, MD

## 2023-09-20 NOTE — Addendum Note (Signed)
 Addended by: LENNIE RONCO T on: 09/20/2023 09:24 AM   Modules accepted: Orders

## 2023-09-21 LAB — GC/CHLAMYDIA PROBE AMP (~~LOC~~) NOT AT ARMC
Chlamydia: NEGATIVE
Comment: NEGATIVE
Comment: NORMAL
Neisseria Gonorrhea: NEGATIVE

## 2023-09-24 ENCOUNTER — Ambulatory Visit: Payer: Self-pay | Admitting: Obstetrics and Gynecology

## 2023-09-24 LAB — CULTURE, BETA STREP (GROUP B ONLY): Strep Gp B Culture: NEGATIVE

## 2023-09-28 ENCOUNTER — Encounter: Payer: Self-pay | Admitting: Obstetrics and Gynecology

## 2023-09-28 ENCOUNTER — Encounter: Admitting: Obstetrics and Gynecology

## 2023-09-29 NOTE — Progress Notes (Signed)
 Patient did not keep her OB appointment for 09/28/2023.  Bebe Izell Raddle MD Attending Center for [employer] [facility])

## 2023-10-04 ENCOUNTER — Telehealth (HOSPITAL_COMMUNITY): Payer: Self-pay | Admitting: *Deleted

## 2023-10-04 ENCOUNTER — Ambulatory Visit (INDEPENDENT_AMBULATORY_CARE_PROVIDER_SITE_OTHER): Payer: Self-pay | Admitting: Obstetrics and Gynecology

## 2023-10-04 ENCOUNTER — Other Ambulatory Visit: Payer: Self-pay

## 2023-10-04 ENCOUNTER — Encounter (HOSPITAL_COMMUNITY): Payer: Self-pay | Admitting: *Deleted

## 2023-10-04 VITALS — BP 126/83 | HR 116 | Wt 172.0 lb

## 2023-10-04 DIAGNOSIS — O24410 Gestational diabetes mellitus in pregnancy, diet controlled: Secondary | ICD-10-CM | POA: Diagnosis not present

## 2023-10-04 DIAGNOSIS — Z3A39 39 weeks gestation of pregnancy: Secondary | ICD-10-CM | POA: Insufficient documentation

## 2023-10-04 DIAGNOSIS — Z148 Genetic carrier of other disease: Secondary | ICD-10-CM

## 2023-10-04 NOTE — Telephone Encounter (Signed)
 Preadmission screen

## 2023-10-04 NOTE — Progress Notes (Signed)
   PRENATAL VISIT NOTE Centering Pregnancy Group 19, Session 9  Subjective:  Dana Castro is a 28 y.o. G2P0010 at [redacted]w[redacted]d being seen today for ongoing prenatal care.  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  Patient reports occasional contractions.  Contractions: Irregular.  .  Movement: Present. Denies leaking of fluid.   The following portions of the patient's history were reviewed and updated as appropriate: allergies, current medications, past family history, past medical history, past social history, past surgical history and problem list.   Objective:    Vitals:   10/04/23 0910  BP: 126/83  Pulse: (!) 116  Weight: 172 lb (78 kg)    Fetal Status:  Fetal Heart Rate (bpm): 138 Fundal Height: 39 cm Movement: Present Presentation: Vertex  General: Alert, oriented and cooperative. Patient is in no acute distress.  Skin: Skin is warm and dry. No rash noted.   Cardiovascular: Normal heart rate noted  Respiratory: Normal respiratory effort, no problems with respiration noted  Abdomen: Soft, gravid, appropriate for gestational age.  Pain/Pressure: Absent     Pelvic: Cervical exam performed in the presence of a chaperone        Extremities: Normal range of motion.  Edema: Trace  Mental Status: Normal mood and affect. Normal behavior. Normal judgment and thought content.   Assessment and Plan:  Pregnancy: G2P0010 at [redacted]w[redacted]d 1. [redacted] weeks gestation of pregnancy (Primary) 2. Diet controlled gestational diabetes mellitus (GDM) in third trimester Fasting 72-76 Postprandial 123-125 Growth 7/2: Est. FW: 2586 gm 5 lb 11 oz 38 %   FH & BP appropriate GBS negative   Centering Pregnancy, Session#9: Reviewed resources in [employer].    Facilitated discussion today:  baby safety around the home, demonstration of soothing techniques for infants Mindfulness activity with visualization of a special place as well as relaxation breathing when thinking about fears or unexpected outcomes for childbirth preparation  Fundal height and FHR appropriate today unless noted otherwise in plan. Patient to continue group care.    IOL scheduled for 8/2-3  3. Carrier of spinal muscular atrophy   Term labor symptoms and general obstetric precautions including but not limited to vaginal bleeding, contractions, leaking of fluid and fetal movement were reviewed in detail with the patient. Please refer to After Visit Summary for other counseling recommendations.   No follow-ups on file.  Future Appointments  Date Time Provider Department Center  10/07/2023 12:00 AM [facility] SCHED ROOM [facility] None  10/11/2023 10:55 AM Anyanwu, Gloris LABOR, MD [facility]  10/18/2023  9:00 AM CENTERING PROVIDER [facility]    Mardy Shropshire, MD

## 2023-10-07 ENCOUNTER — Inpatient Hospital Stay (HOSPITAL_COMMUNITY): Admitting: Anesthesiology

## 2023-10-07 ENCOUNTER — Encounter (HOSPITAL_COMMUNITY): Payer: Self-pay | Admitting: Family Medicine

## 2023-10-07 ENCOUNTER — Other Ambulatory Visit: Payer: Self-pay

## 2023-10-07 ENCOUNTER — Inpatient Hospital Stay (HOSPITAL_COMMUNITY)
Admission: RE | Admit: 2023-10-07 | Discharge: 2023-10-09 | DRG: 807 | Disposition: A | Discharge status: Home / Self Care | Attending: Obstetrics and Gynecology | Admitting: Obstetrics and Gynecology

## 2023-10-07 ENCOUNTER — Inpatient Hospital Stay (HOSPITAL_COMMUNITY)

## 2023-10-07 DIAGNOSIS — Z77098 Contact with and (suspected) exposure to other hazardous, chiefly nonmedicinal, chemicals: Secondary | ICD-10-CM | POA: Diagnosis present

## 2023-10-07 DIAGNOSIS — O24420 Gestational diabetes mellitus in childbirth, diet controlled: Secondary | ICD-10-CM | POA: Diagnosis not present

## 2023-10-07 DIAGNOSIS — Z87891 Personal history of nicotine dependence: Secondary | ICD-10-CM

## 2023-10-07 DIAGNOSIS — O99214 Obesity complicating childbirth: Secondary | ICD-10-CM | POA: Diagnosis present

## 2023-10-07 DIAGNOSIS — O480 Post-term pregnancy: Secondary | ICD-10-CM | POA: Diagnosis not present

## 2023-10-07 DIAGNOSIS — O9962 Diseases of the digestive system complicating childbirth: Secondary | ICD-10-CM | POA: Diagnosis present

## 2023-10-07 DIAGNOSIS — K219 Gastro-esophageal reflux disease without esophagitis: Secondary | ICD-10-CM | POA: Diagnosis present

## 2023-10-07 DIAGNOSIS — Z148 Genetic carrier of other disease: Secondary | ICD-10-CM

## 2023-10-07 DIAGNOSIS — Z3A39 39 weeks gestation of pregnancy: Secondary | ICD-10-CM

## 2023-10-07 DIAGNOSIS — Z3490 Encounter for supervision of normal pregnancy, unspecified, unspecified trimester: Principal | ICD-10-CM | POA: Diagnosis present

## 2023-10-07 DIAGNOSIS — Z833 Family history of diabetes mellitus: Secondary | ICD-10-CM

## 2023-10-07 DIAGNOSIS — Z8632 Personal history of gestational diabetes: Secondary | ICD-10-CM | POA: Diagnosis present

## 2023-10-07 DIAGNOSIS — Z3A40 40 weeks gestation of pregnancy: Secondary | ICD-10-CM | POA: Diagnosis not present

## 2023-10-07 DIAGNOSIS — Z8249 Family history of ischemic heart disease and other diseases of the circulatory system: Secondary | ICD-10-CM

## 2023-10-07 DIAGNOSIS — O24419 Gestational diabetes mellitus in pregnancy, unspecified control: Secondary | ICD-10-CM | POA: Diagnosis present

## 2023-10-07 LAB — CBC
HCT: 38.5 % (ref 36.0–46.0)
Hemoglobin: 13 g/dL (ref 12.0–15.0)
MCH: 28.9 pg (ref 26.0–34.0)
MCHC: 33.8 g/dL (ref 30.0–36.0)
MCV: 85.6 fL (ref 80.0–100.0)
Platelets: 280 K/uL (ref 150–400)
RBC: 4.5 MIL/uL (ref 3.87–5.11)
RDW: 13.8 % (ref 11.5–15.5)
WBC: 9.1 K/uL (ref 4.0–10.5)
nRBC: 0 % (ref 0.0–0.2)

## 2023-10-07 LAB — GLUCOSE, CAPILLARY
Glucose-Capillary: 105 mg/dL — ABNORMAL HIGH (ref 70–99)
Glucose-Capillary: 89 mg/dL (ref 70–99)
Glucose-Capillary: 96 mg/dL (ref 70–99)
Glucose-Capillary: 91 mg/dL (ref 70–99)
Glucose-Capillary: 104 mg/dL — ABNORMAL HIGH (ref 70–99)
Glucose-Capillary: 134 mg/dL — ABNORMAL HIGH (ref 70–99)

## 2023-10-07 LAB — COMPREHENSIVE METABOLIC PANEL WITH GFR
ALT: 11 U/L (ref 0–44)
AST: 21 U/L (ref 15–41)
Albumin: 2.6 g/dL — ABNORMAL LOW (ref 3.5–5.0)
Alkaline Phosphatase: 176 U/L — ABNORMAL HIGH (ref 38–126)
Anion gap: 8 (ref 5–15)
BUN: 10 mg/dL (ref 6–20)
CO2: 21 mmol/L — ABNORMAL LOW (ref 22–32)
Calcium: 9.1 mg/dL (ref 8.9–10.3)
Chloride: 107 mmol/L (ref 98–111)
Creatinine, Ser: 0.74 mg/dL (ref 0.44–1.00)
GFR, Estimated: >60 mL/min (ref >60)
Glucose, Bld: 87 mg/dL (ref 70–99)
Potassium: 4.3 mmol/L (ref 3.5–5.1)
Sodium: 136 mmol/L (ref 135–145)
Total Bilirubin: 0.4 mg/dL (ref 0.0–1.2)
Total Protein: 6.1 g/dL — ABNORMAL LOW (ref 6.5–8.1)

## 2023-10-07 LAB — TYPE AND SCREEN
ABO/RH(D): O POS
Antibody Screen: NEGATIVE

## 2023-10-07 LAB — RPR: RPR Ser Ql: NONREACTIVE

## 2023-10-07 MED ORDER — OXYCODONE-ACETAMINOPHEN 5-325 MG PO TABS: 1.0000 | ORAL_TABLET | ORAL | Status: DC | PRN

## 2023-10-07 MED ORDER — DIPHENHYDRAMINE HCL 50 MG/ML IJ SOLN: 12.5000 mg | INTRAMUSCULAR | Status: DC | PRN

## 2023-10-07 MED ORDER — PHENYLEPHRINE 80 MCG/ML (10ML) SYRINGE FOR IV PUSH (FOR BLOOD PRESSURE SUPPORT): 80.0000 ug | PREFILLED_SYRINGE | INTRAVENOUS | Status: DC | PRN

## 2023-10-07 MED ORDER — TERBUTALINE SULFATE 1 MG/ML IJ SOLN: 0.2500 mg | Freq: Once | INTRAMUSCULAR | Status: DC | PRN

## 2023-10-07 MED ORDER — OXYTOCIN-SODIUM CHLORIDE 30-0.9 UT/500ML-% IV SOLN
1.0000 m[IU]/min | INTRAVENOUS | Status: DC
  Administered 2023-10-07: 2 m[IU]/min via INTRAVENOUS
  Filled 2023-10-07: qty 500

## 2023-10-07 MED ORDER — EPHEDRINE 5 MG/ML INJ: 10.0000 mg | INTRAVENOUS | Status: DC | PRN

## 2023-10-07 MED ORDER — FENTANYL CITRATE (PF) 100 MCG/2ML IJ SOLN: 50.0000 ug | INTRAMUSCULAR | Status: DC | PRN

## 2023-10-07 MED ORDER — LIDOCAINE HCL (PF) 1 % IJ SOLN: 30.0000 mL | INTRAMUSCULAR | Status: DC | PRN

## 2023-10-07 MED ORDER — OXYTOCIN BOLUS FROM INFUSION
333.0000 mL | Freq: Once | INTRAVENOUS | Status: AC
  Administered 2023-10-08: 333 mL via INTRAVENOUS

## 2023-10-07 MED ORDER — ONDANSETRON HCL 4 MG/2ML IJ SOLN: 4.0000 mg | Freq: Four times a day (QID) | INTRAMUSCULAR | Status: DC | PRN

## 2023-10-07 MED ORDER — OXYCODONE-ACETAMINOPHEN 5-325 MG PO TABS: 2.0000 | ORAL_TABLET | ORAL | Status: DC | PRN

## 2023-10-07 MED ORDER — LACTATED RINGERS IV SOLN: 500.0000 mL | Freq: Once | INTRAVENOUS | Status: DC

## 2023-10-07 MED ORDER — MISOPROSTOL 25 MCG QUARTER TABLET: 25.0000 ug | ORAL_TABLET | Freq: Once | ORAL | Status: DC

## 2023-10-07 MED ORDER — LIDOCAINE HCL (PF) 1 % IJ SOLN
INTRAMUSCULAR | Status: DC | PRN
  Administered 2023-10-07: 4 mL via EPIDURAL
  Administered 2023-10-07: 3 mL via EPIDURAL

## 2023-10-07 MED ORDER — SOD CITRATE-CITRIC ACID 500-334 MG/5ML PO SOLN: 30.0000 mL | ORAL | Status: DC | PRN

## 2023-10-07 MED ORDER — LACTATED RINGERS IV SOLN: INTRAVENOUS | Status: DC

## 2023-10-07 MED ORDER — FENTANYL-BUPIVACAINE-NACL 0.5-0.125-0.9 MG/250ML-% EP SOLN
12.0000 mL/h | EPIDURAL | Status: DC | PRN
  Administered 2023-10-07: 10.5 mL/h via EPIDURAL
  Filled 2023-10-07: qty 250

## 2023-10-07 MED ORDER — ACETAMINOPHEN 325 MG PO TABS: 650.0000 mg | ORAL_TABLET | ORAL | Status: DC | PRN

## 2023-10-07 MED ORDER — OXYTOCIN-SODIUM CHLORIDE 30-0.9 UT/500ML-% IV SOLN
2.5000 [IU]/h | INTRAVENOUS | Status: DC
  Filled 2023-10-07: qty 500

## 2023-10-07 MED ORDER — LACTATED RINGERS IV SOLN: 500.0000 mL | INTRAVENOUS | Status: DC | PRN

## 2023-10-07 MED ORDER — MISOPROSTOL 50MCG HALF TABLET: 50.0000 ug | ORAL_TABLET | Freq: Once | ORAL | Status: DC

## 2023-10-07 NOTE — Progress Notes (Signed)
 LABOR PROGRESS NOTE  Patient Name: Dana Castro, female   DOB: August 31, 1995, 28 y.o.  MRN: [id number]  Pt comfortable with epidural. Discuss roles, risks and benefits of IUPC later if similar on recheck.   Blood pressure 130/84, pulse (!) 117, temperature 97.9 F (36.6 C), temperature source Oral, resp. rate 16, height 5' (1.524 m), weight 78.4 kg, last menstrual period 01/01/2023, SpO2 97%.  Dilation: 5.5 Effacement (%): 50 Station: -2 Presentation: Vertex Exam by:: Alan Sandifer, RN  EFM: baseline 135, accels, no decels, moderate variability TOCO: q3-68min contractions  Contracting well. Continue to titrate pitocin .   Mardy Shropshire, MD

## 2023-10-07 NOTE — H&P (Signed)
 OBSTETRIC ADMISSION HISTORY AND PHYSICAL  Dana Castro is a 28 y.o. female G2P0010 with IUP at [redacted]w[redacted]d by LMP presenting for IOL for A1GDM. She reports +FMs, No LOF, no VB, no blurry vision, headaches or peripheral edema, and RUQ pain.  She plans on breast and formula feeding. She is undecided for birth control. 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, welding  Past Medical History: Past Medical History:  Diagnosis Date   GDM (gestational diabetes mellitus)    Gestational diabetes    Medical history non-contributory    Ovarian cyst    high school   Urinary tract infection    UTI (urinary tract infection)     Past Surgical History: Past Surgical History:  Procedure Laterality Date   NO PAST SURGERIES      Obstetrical History: OB History     Gravida  2   Para      Term      Preterm      AB  1   Living         SAB      IAB  1   Ectopic      Multiple      Live Births           Obstetric Comments  2022 pills         Social History Social History   Socioeconomic History   Marital status: Single    Spouse name: Not on file   Number of children: Not on file   Years of education: Not on file   Highest education level: Not on file  Occupational History   Not on file  Tobacco Use   Smoking status: Former    Types: Cigarettes   Smokeless tobacco: Never  Vaping Use   Vaping status: Never Used  Substance and Sexual Activity   Alcohol use: Not Currently    Comment: occasionally   Drug use: Not Currently    Types: Marijuana   Sexual activity: Yes    Birth control/protection: None  Other Topics Concern   Not on file  Social History Narrative   Not on file    Social Drivers of Health   Financial Resource Strain: Not on file  Food Insecurity: No Food Insecurity (09/20/2023)   Hunger Vital Sign    Worried About Running Out of Food in the Last Year: Never true    Ran Out of Food in the Last Year: Never true  Transportation Needs: No Transportation Needs (09/20/2023)   PRAPARE - Administrator, Civil Service (Medical): No    Lack of Transportation (Non-Medical): No  Physical Activity: Not on file  Stress: Not on file  Social Connections: Unknown (07/20/2021)   Received [facility]   Social Network    Social Network: Not on file    Family History: Family History  Problem Relation Age of Onset   Heart disease Mother        pacemaker 2024   Diabetes Father    Diabetes Paternal Grandmother    Hypertension Paternal Grandmother    Diabetes Other     Allergies: No Known Allergies  Medications Prior to Admission  Medication  Sig Dispense Refill Last Dose/Taking   Blood Glucose Monitoring Suppl DEVI 1 each by Does not apply route in the morning, at noon, and at bedtime. May substitute to any manufacturer covered by patient's insurance. 1 each 0    metroNIDAZOLE  (FLAGYL ) 500 MG tablet Take 1 tablet (500 mg total) by mouth 2 (two) times daily. (Patient not taking: Reported on 07/26/2023) 14 tablet 0    Prenatal Vit-Fe Fumarate-FA (MULTIVITAMIN-PRENATAL) 27-0.8 MG TABS tablet Take 1 tablet by mouth daily at 12 noon.        Review of Systems   All systems reviewed and negative except as stated in HPI  Height 5' (1.524 m), weight 78.4 kg, last menstrual period 01/01/2023. General appearance: alert, cooperative, appears stated age, and no distress Lungs: clear to auscultation bilaterally Heart: regular rate and rhythm Abdomen: soft, non-tender; bowel sounds normal Pelvic: adequate, unproven Extremities: Homans sign is negative, no sign of DVT DTR's 2+ Presentation: cephalic Fetal monitoringBaseline: 135 bpm,  Variability: Good {> 6 bpm), Accelerations: Reactive, and Decelerations: Absent Uterine activity - irritability     Prenatal labs: ABO, Rh: O/Positive/-- (03/04 1629) Antibody: Negative (03/04 1629) Rubella: 1.42 (03/04 1629) RPR: Non Reactive (05/21 0917)  HBsAg: Negative (03/04 1629)  HIV: Non Reactive (03/04 1629)  GBS: Negative/-- (07/16 1727)    Lab Results  Component Value Date   GBS Negative 09/20/2023   GTT abnormal Genetic screening  SMA carrier, negative, low risk Anatomy US  normal female  Immunization History  Administered Date(s) Administered   Hepatitis A 09/16/1999, 07/17/2000   Hepatitis B 08/12/1996, 10/23/1996, 01/03/1997   Hpv-Unspecified 11/24/2009, 08/30/2010, 06/06/2013   Influenza,inj,Quad PF,6+ Mos 11/23/2017   MMR 07/23/1997, 07/17/2000   Td 07/26/2017   Tdap 10/25/2007    Prenatal Transfer Tool  Maternal Diabetes: Yes:  Diabetes Type:  Diet controlled Genetic Screening: Normal Maternal Ultrasounds/Referrals: Normal Fetal Ultrasounds or other Referrals:  None Maternal Substance Abuse:  No Significant Maternal Medications:  None Significant Maternal Lab Results: Group B Strep negative Number of Prenatal Visits:greater than 3 verified prenatal visits Maternal Vaccinations: Declined Other Comments:  None   No results found for this or any previous visit (from the past 24 hours).  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 G2P0010 at [redacted]w[redacted]d here for IOL for A1GDM  #Labor:FB inserted, will given an hour or two to see if contraction/cramping increase, otherwise will likely add pitocin  to 6units given very soft  cervix.  #Pain: Per pt request #FWB: Cat I #GBS status:  negative #Feeding: Breastmilk  and Formula #Reproductive Life planning: Undecided #Circ:  not applicable  #GDM A1 - q4h CBG in latent labor, 2hr in active CBG (last 3)  Recent Labs    10/07/23 0110  GLUCAP 105*     Mardy Shropshire, MD  10/07/2023, 12:15 AM

## 2023-10-07 NOTE — Anesthesia Procedure Notes (Signed)
 Epidural Patient location during procedure: OB Start time: 10/07/2023 3:34 PM End time: 10/07/2023 3:42 PM  Staffing Anesthesiologist: Jerrye Sharper, MD Performed: anesthesiologist   Preanesthetic Checklist Completed: patient identified, IV checked, site marked, risks and benefits discussed, surgical consent, monitors and equipment checked, pre-op evaluation and timeout performed  Epidural Patient position: sitting Prep: DuraPrep and site prepped and draped Patient monitoring: continuous pulse ox and blood pressure Approach: midline Location: L3-L4 Injection technique: LOR air  Needle:  Needle type: Tuohy  Needle gauge: 17 G Needle length: 9 cm and 9 Needle insertion depth: 5 cm Catheter type: closed end flexible Catheter size: 19 Gauge Catheter at skin depth: 10 cm Test dose: negative and Other  Assessment Events: blood not aspirated, no cerebrospinal fluid, injection not painful, no injection resistance, no paresthesia and negative IV test  Additional Notes Patient identified. Risks and benefits discussed including failed block, incomplete  Pain control, post dural puncture headache, nerve damage, paralysis, blood pressure Changes, nausea, vomiting, reactions to medications-both toxic and allergic and post Partum back pain. All questions were answered. Patient expressed understanding and wished to proceed. Sterile technique was used throughout procedure. Epidural site was Dressed with sterile barrier dressing. No paresthesias, signs of intravascular injection Or signs of intrathecal spread were encountered.  Patient was more comfortable after the epidural was dosed. Please see RN's note for documentation of vital signs and FHR which are stable. Reason for block:procedure for pain

## 2023-10-07 NOTE — Progress Notes (Signed)
 LABOR PROGRESS NOTE  Patient Name: Dana Castro, female   DOB: [birth year], 28 y.o.  MRN: [id number]  FB out. Pt coping well. Mild cramping.   R/B/A of AROM discussed with patient, and verbal consent obtained.   Blood pressure (!) 142/90, pulse 99, height 5' (1.524 m), weight 78.4 kg, last menstrual period 01/01/2023.  Dilation: 3.5 Effacement (%): 50 Station: -2 Presentation: Vertex Exam by:: Dr. Loyola  EFM: baseline 135, accels, no decels, moderate variability TOCO: uterine irritability  Babe's head found to be well-applied. Obtained scant clear fluid. Mom and babe tolerated well.  Will give an hour, if contractions pick up continue expectantly, if not start pitocin  2x2.   Mardy Loyola, MD

## 2023-10-07 NOTE — Anesthesia Preprocedure Evaluation (Signed)
 Anesthesia Evaluation  Patient identified by MRN, date of birth, ID band Patient awake    Reviewed: Allergy & Precautions, Patient's Chart, lab work & pertinent test results  Airway Mallampati: II  TM Distance: >3 FB     Dental no notable dental hx.    Pulmonary former smoker   Pulmonary exam normal        Cardiovascular negative cardio ROS Normal cardiovascular exam Rhythm:Regular     Neuro/Psych negative neurological ROS  negative psych ROS   GI/Hepatic Neg liver ROS,GERD  ,,  Endo/Other  diabetes, Well Controlled, Gestational  Obesity  Renal/GU negative Renal ROS  negative genitourinary   Musculoskeletal negative musculoskeletal ROS (+)    Abdominal   Peds  Hematology negative hematology ROS (+)   Anesthesia Other Findings   Reproductive/Obstetrics (+) Pregnancy                              Anesthesia Physical Anesthesia Plan  ASA: 2  Anesthesia Plan: Epidural   Post-op Pain Management:    Induction:   PONV Risk Score and Plan:   Airway Management Planned: Natural Airway  Additional Equipment: Fetal Monitoring and None  Intra-op Plan:   Post-operative Plan:   Informed Consent: I have reviewed the patients History and Physical, chart, labs and discussed the procedure including the risks, benefits and alternatives for the proposed anesthesia with the patient or authorized representative who has indicated his/her understanding and acceptance.       Plan Discussed with: Anesthesiologist  Anesthesia Plan Comments:          Anesthesia Quick Evaluation

## 2023-10-07 NOTE — Progress Notes (Signed)
 LABOR PROGRESS NOTE  Patient Name: Dana Castro, female   DOB: 1995/09/15, 28 y.o.  MRN: [id number]  Pt has remained with Cat 1 strip all day.  Making slow progress in cervical exams.  Continue Pit titration PRN, currently 16U.   Augustin JAYSON Slade, MD

## 2023-10-08 ENCOUNTER — Encounter (HOSPITAL_COMMUNITY): Payer: Self-pay | Admitting: Obstetrics and Gynecology

## 2023-10-08 DIAGNOSIS — O480 Post-term pregnancy: Secondary | ICD-10-CM

## 2023-10-08 DIAGNOSIS — O24420 Gestational diabetes mellitus in childbirth, diet controlled: Secondary | ICD-10-CM

## 2023-10-08 DIAGNOSIS — Z3A40 40 weeks gestation of pregnancy: Secondary | ICD-10-CM

## 2023-10-08 LAB — CBC
HCT: 37.1 % (ref 36.0–46.0)
Hemoglobin: 12.6 g/dL (ref 12.0–15.0)
MCH: 29.4 pg (ref 26.0–34.0)
MCHC: 34 g/dL (ref 30.0–36.0)
MCV: 86.5 fL (ref 80.0–100.0)
Platelets: 267 K/uL (ref 150–400)
RBC: 4.29 MIL/uL (ref 3.87–5.11)
RDW: 13.9 % (ref 11.5–15.5)
WBC: 23.4 K/uL — ABNORMAL HIGH (ref 4.0–10.5)
nRBC: 0 % (ref 0.0–0.2)

## 2023-10-08 MED ORDER — MEASLES, MUMPS & RUBELLA VAC IJ SOLR: 0.5000 mL | Freq: Once | INTRAMUSCULAR | Status: DC

## 2023-10-08 MED ORDER — WITCH HAZEL-GLYCERIN EX PADS: 1.0000 | MEDICATED_PAD | CUTANEOUS | Status: DC | PRN

## 2023-10-08 MED ORDER — SODIUM CHLORIDE 0.9% FLUSH: 3.0000 mL | INTRAVENOUS | Status: DC | PRN

## 2023-10-08 MED ORDER — SENNOSIDES-DOCUSATE SODIUM 8.6-50 MG PO TABS
2.0000 | ORAL_TABLET | ORAL | Status: DC
  Administered 2023-10-08 – 2023-10-09 (×2): 2 via ORAL
  Filled 2023-10-08 (×2): qty 2

## 2023-10-08 MED ORDER — SIMETHICONE 80 MG PO CHEW
80.0000 mg | CHEWABLE_TABLET | ORAL | Status: DC | PRN
Start: 2023-10-08 — End: 2023-10-09

## 2023-10-08 MED ORDER — OXYCODONE HCL 5 MG PO TABS: 5.0000 mg | ORAL_TABLET | ORAL | Status: DC | PRN

## 2023-10-08 MED ORDER — DIBUCAINE (PERIANAL) 1 % EX OINT: 1.0000 | TOPICAL_OINTMENT | CUTANEOUS | Status: DC | PRN

## 2023-10-08 MED ORDER — BENZOCAINE-MENTHOL 20-0.5 % EX AERO: 1.0000 | INHALATION_SPRAY | CUTANEOUS | Status: DC | PRN

## 2023-10-08 MED ORDER — ONDANSETRON HCL 4 MG/2ML IJ SOLN: 4.0000 mg | INTRAMUSCULAR | Status: DC | PRN

## 2023-10-08 MED ORDER — ACETAMINOPHEN 325 MG PO TABS: 650.0000 mg | ORAL_TABLET | ORAL | Status: DC | PRN

## 2023-10-08 MED ORDER — IBUPROFEN 600 MG PO TABS
600.0000 mg | ORAL_TABLET | Freq: Four times a day (QID) | ORAL | Status: DC
  Administered 2023-10-08 – 2023-10-09 (×6): 600 mg via ORAL
  Filled 2023-10-08 (×6): qty 1

## 2023-10-08 MED ORDER — SODIUM CHLORIDE 0.9% FLUSH
3.0000 mL | Freq: Two times a day (BID) | INTRAVENOUS | Status: DC
  Administered 2023-10-08: 3 mL via INTRAVENOUS

## 2023-10-08 MED ORDER — COCONUT OIL OIL
1.0000 | TOPICAL_OIL | Status: DC | PRN
  Administered 2023-10-09: 1 via TOPICAL

## 2023-10-08 MED ORDER — TETANUS-DIPHTH-ACELL PERTUSSIS 5-2.5-18.5 LF-MCG/0.5 IM SUSY: 0.5000 mL | PREFILLED_SYRINGE | Freq: Once | INTRAMUSCULAR | Status: DC

## 2023-10-08 MED ORDER — ONDANSETRON HCL 4 MG PO TABS: 4.0000 mg | ORAL_TABLET | ORAL | Status: DC | PRN

## 2023-10-08 MED ORDER — DIPHENHYDRAMINE HCL 25 MG PO CAPS: 25.0000 mg | ORAL_CAPSULE | Freq: Four times a day (QID) | ORAL | Status: DC | PRN

## 2023-10-08 MED ORDER — PRENATAL MULTIVITAMIN CH
1.0000 | ORAL_TABLET | Freq: Every day | ORAL | Status: DC
  Administered 2023-10-08 – 2023-10-09 (×2): 1 via ORAL
  Filled 2023-10-08 (×2): qty 1

## 2023-10-08 MED ORDER — ZOLPIDEM TARTRATE 5 MG PO TABS
5.0000 mg | ORAL_TABLET | Freq: Every evening | ORAL | Status: DC | PRN
Start: 2023-10-08 — End: 2023-10-09

## 2023-10-08 MED ORDER — SODIUM CHLORIDE 0.9 % IV SOLN: 250.0000 mL | INTRAVENOUS | Status: DC | PRN

## 2023-10-08 NOTE — Lactation Note (Signed)
 This note was copied from a baby's chart. Lactation Consultation Note  Patient Name: Dana Castro Unijb'd Date: 10/08/2023 Age:27 hours Reason for consult: Difficult latch;1st time breastfeeding;Primapara;Mother's request;Term  P1, 40 wks, @ 17 hrs of life. Mom request latch assist. Mom has started on left breast- harder side- praised mom- offered positioning/hold suggestions. Re-enforced starting with hand expression. Demonstrated steps of latching, infant feeds off both breasts- 20 minutes overall.  Cross-cradle and football demonstrated with mom, encouraged mom to find her comfort with each. Encouraged mom they are doing well learning each other. Encouraged mom to call anytime assist desired.  Maternal Data Has patient been taught Hand Expression?: Yes Does the patient have breastfeeding experience prior to this delivery?: No  Feeding Mother's Current Feeding Choice: Breast Milk  LATCH Score Latch: Grasps breast easily, tongue down, lips flanged, rhythmical sucking.  Audible Swallowing: Spontaneous and intermittent  Type of Nipple: Flat (Baby has some frustration @ breast shape)  Comfort (Breast/Nipple): Soft / non-tender  Hold (Positioning): Assistance needed to correctly position infant at breast and maintain latch. (Mom takes over)  [facility] Score: 8   Lactation Tools Discussed/Used    Interventions Interventions: Breast feeding basics reviewed;Assisted with latch;Hand express;Breast compression;Adjust position;Support pillows;Education  Discharge Pump: Manual;Hands Free;Personal  Consult Status Consult Status: Follow-up Date: 10/09/23 Follow-up type: In-patient    [facility] 10/08/2023, 6:43 PM

## 2023-10-08 NOTE — Anesthesia Postprocedure Evaluation (Signed)
 Anesthesia Post Note  Patient: Dana Castro  Procedure(s) Performed: AN AD HOC LABOR EPIDURAL     Anesthesia Type: Epidural Anesthetic complications: no   No notable events documented.  Last Vitals:  Vitals:   10/08/23 0346 10/08/23 0749  BP: 133/75 123/81  Pulse: (!) 108 (!) 117  Resp: 16 20  Temp: 37.4 C 36.6 C  SpO2: 98% 97%    Last Pain:  Vitals:   10/08/23 0749  TempSrc: Oral  PainSc: 2    Pain Goal:                   Dana Castro

## 2023-10-08 NOTE — Lactation Note (Deleted)
 This note was copied from a baby's chart. Lactation Consultation Note  Patient Name: Dana Castro Date: 10/08/2023 Age:27 hours Reason for consult: Initial assessment;1st time breastfeeding;Primapara;Term  P1, 40 wks, @ 8 hrs of life. Per mom baby did well @ breast before, but sleepy since. Reassured mom normal to have sleepy first day- Mom brought her wearable pump with her, discussed good once milk is flowing. Also encouraged hand pump from us  to go home with. Encouraged mom to call for next latch.  Discussed expectations @ breast- Day 1- sleepy/ feed every 3 hrs/ even 10 minutes is okay, Day 2 more awake/ feeding cues/longer feeds, and cluster feeding overnights brings milk in. Highlighted breast stimulation is tied directly to milk production. Discussed hands on breast and baby, keeping baby awake @ breast. Starting with hand expression & breast compression to get baby working @ breast, and gentle stimulation to keep baby working @ breast. Encouraged EBM for nipple care post feed. LC services and milk storage shared. Encouraged mom to call for assist anytime desired.  Maternal Data Does the patient have breastfeeding experience prior to this delivery?: No  Feeding Mother's Current Feeding Choice: Breast Milk   Interventions Interventions: Breast feeding basics reviewed;Education;LC Services brochure;CDC milk storage guidelines  Discharge Pump: Hands Free;Personal (Encouraged mom @ go home time- get a hand pump from us  as well.)  Consult Status Consult Status: Follow-up Date: 10/09/23 Follow-up type: In-patient    Aslee Such 10/08/2023, 9:25 AM

## 2023-10-08 NOTE — Lactation Note (Signed)
 This note was copied from a baby's chart.  Lactation Consultation Note   Patient Name: Dana Castro Date: 10/08/2023 Age:27 hours Reason for consult: Initial assessment;1st time breastfeeding;Primapara;Term   P1, 40 wks, @ 8 hrs of life. Per mom baby did well @ breast before, but sleepy since. Reassured mom normal to have sleepy first day- Mom brought her wearable pump with her, discussed good once milk is flowing. Also encouraged hand pump from us  to go home with. Encouraged mom to call for next latch.  Discussed expectations @ breast- Day 1- sleepy/ feed every 3 hrs/ even 10 minutes is okay, Day 2 more awake/ feeding cues/longer feeds, and cluster feeding overnights brings milk in. Highlighted breast stimulation is tied directly to milk production. Discussed hands on breast and baby, keeping baby awake @ breast. Starting with hand expression & breast compression to get baby working @ breast, and gentle stimulation to keep baby working @ breast. Encouraged EBM for nipple care post feed. LC services and milk storage shared. Encouraged mom to call for assist anytime desired.   Maternal Data Does the patient have breastfeeding experience prior to this delivery?: No   Feeding Mother's Current Feeding Choice: Breast Milk     Interventions Interventions: Breast feeding basics reviewed;Education;LC Services brochure;CDC milk storage guidelines   Discharge Pump: Hands Free;Personal (Encouraged mom @ go home time- get a hand pump from us  as well.)   Consult Status Consult Status: Follow-up Date: 10/09/23 Follow-up type: In-patient

## 2023-10-08 NOTE — Discharge Summary (Signed)
 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 yo G2P0010 at [redacted]w[redacted]d was admitted to the hospital 10/07/2023 for induction of labor.  Indication for induction: A2 DM.  Patient had an uncomplicated labor course. Membrane Rupture Time/Date: 1:53 AM,10/07/2023  Delivery Method:Vaginal, Spontaneous Operative Delivery:N/A Episiotomy: None Lacerations:  None Details of delivery can be found in separate delivery note.  Patient had a postpartum course complicated by***. Patient is discharged home 10/08/23.  Newborn Data: Birth date:10/08/2023 Birth time:12:42 AM Gender:Female Living status:Living Apgars:9 ,9  Weight:   Magnesium Sulfate received: {Mag received:30440022} BMZ received: No Rhophylac:N/A MMR:Yes T-DaP:{Tdap:23962} Flu: No RSV Vaccine received: No Transfusion:{Transfusion received:30440034}  Immunizations received: Immunization History  Administered Date(s) Administered   Hepatitis A 09/16/1999, 07/17/2000   Hepatitis B 08/12/1996, 10/23/1996, 01/03/1997   Hpv-Unspecified 11/24/2009, 08/30/2010,  06/06/2013   Influenza,inj,Quad PF,6+ Mos 11/23/2017   MMR 07/23/1997, 07/17/2000   Td 07/26/2017   Tdap 10/25/2007    Physical exam  Vitals:   10/08/23 0058 10/08/23 0108 10/08/23 0116 10/08/23 0132  BP:  137/67 121/75 116/64  Pulse:  (!) 136 (!) 130 (!) 119  Resp: 18     Temp: 98.8 F (37.1 C)     TempSrc: Oral     SpO2:      Weight:      Height:       General: {Exam; general:21111117} Lochia: {Desc; appropriate/inappropriate:30686::appropriate} Uterine Fundus: {Desc; firm/soft:30687} Incision: {Exam; incision:21111123} DVT Evaluation: {Exam; dvt:2111122} Labs: Lab Results  Component Value Date   WBC 9.1 10/07/2023   HGB 13.0 10/07/2023   HCT 38.5 10/07/2023   MCV 85.6 10/07/2023   PLT 280 10/07/2023      Latest Ref Rng & Units 10/07/2023    3:01 AM  CMP  Glucose 70 - 99 mg/dL 87   BUN 6 - 20 mg/dL 10   Creatinine 9.55 - 1.00 mg/dL 9.25   Sodium 864 - 854 mmol/L 136   Potassium 3.5 - 5.1 mmol/L 4.3   Chloride 98 - 111 mmol/L 107   CO2 22 - 32 mmol/L 21   Calcium 8.9 - 10.3 mg/dL 9.1   Total Protein 6.5 - 8.1 g/dL 6.1   Total Bilirubin 0.0 - 1.2 mg/dL 0.4   Alkaline Phos 38 - 126 U/L 176   AST 15 - 41 U/L 21   ALT 0 - 44 U/L 11    Edinburgh Score:     No data to display         No data  recorded  After visit meds:  Allergies as of 10/08/2023   No Known Allergies   Med Rec must be completed prior to using this [facility]***        Discharge home in stable condition Infant Feeding: Bottle and Breast Infant Disposition:home with mother Discharge instruction: per After Visit Summary and Postpartum booklet. Activity: Advance as tolerated. Pelvic rest for 6 weeks.  Diet: routine diet Future Appointments: Future Appointments  Date Time Provider Department Center  10/18/2023  9:00 AM CENTERING PROVIDER [facility]   Follow up Visit:  Follow-up Information     Center for [facility]. Schedule an  appointment as soon as possible for a visit in 4 week(s).   Specialty: Obstetrics and Gynecology Why: Routine postpartum follow up in 4-6 weeks Contact information: [address] [place]  [address] [phone]               Message to [facility] 8/3  Please schedule this patient for a In person postpartum visit in 4 weeks with the following provider: Dr. Eldonna. Additional Postpartum F/U:2 hour GTT  High risk pregnancy complicated by: GDM Delivery mode:  Vaginal, Spontaneous Anticipated Birth Control:  Unsure   10/08/2023 Mardy Shropshire, MD

## 2023-10-09 MED ORDER — IBUPROFEN 600 MG PO TABS: 600.0000 mg | ORAL_TABLET | Freq: Four times a day (QID) | ORAL | 0 refills | Status: DC

## 2023-10-09 NOTE — Progress Notes (Signed)
 POSTPARTUM PROGRESS NOTE  Post Partum Day 1  Subjective:  Dana Castro is a 28 y.o. G2P1011 s/p SVD at [redacted]w[redacted]d.  She reports she is doing well. No acute events overnight. She denies any problems with ambulating, voiding or po intake. Denies nausea or vomiting.  Pain is well controlled.  Lochia is similar to a period.  Objective: Blood pressure 116/88, pulse 85, temperature 98.3 F (36.8 C), temperature source Oral, resp. rate 16, height 5' (1.524 m), weight 78.4 kg, last menstrual period 01/01/2023, SpO2 97%, unknown if currently breastfeeding.  Physical Exam:  General: alert, cooperative and no distress Chest: no respiratory distress Heart:regular rate, distal pulses intact Uterine Fundus: firm, appropriately tender DVT Evaluation: No calf swelling or tenderness Extremities: No edema Skin: warm, dry  Recent Labs    10/07/23 0025 10/08/23 0429  HGB 13.0 12.6  HCT 38.5 37.1    Assessment/Plan: Dana Castro is a 28 y.o. G2P1011 s/p SVD at [redacted]w[redacted]d   PPD#1 - Doing well  Routine postpartum care  A1GDM:   - Fasting glucose ordered for tomorrow  - Needs A1c vs 2-hr GTT in the OP setting  Contraception: Undecided Feeding: Breast and formula although would like to exclusively breastfeed Dispo: Plan for discharge tomorrow.    LOS: 2 days   Vernell DELENA School, MD Family Medicine Resident 10/09/2023, 12:03 PM

## 2023-10-09 NOTE — Lactation Note (Signed)
 This note was copied from a baby's chart. Lactation Consultation Note  Patient Name: Dana Castro Date: 10/09/2023 Age:27 hours Reason for consult: Follow-up assessment;1st time breastfeeding;Term  P1, Mother states she started formula during the night because the baby was fussy at the breast. Discussed cluster feeding. Provided mother with manual pump fitted with 18 mm flange and coconut oil. Reviewed engorgement care and monitoring voids/stools. Assisted with latching in football hold.   Maternal Data Has patient been taught Hand Expression?: Yes Does the patient have breastfeeding experience prior to this delivery?: No  Feeding Mother's Current Feeding Choice: Breast Milk and Formula  LATCH Score Latch: Grasps breast easily, tongue down, lips flanged, rhythmical sucking.  Audible Swallowing: A few with stimulation  Type of Nipple: Everted at rest and after stimulation  Comfort (Breast/Nipple): Filling, red/small blisters or bruises, mild/mod discomfort  Hold (Positioning): No assistance needed to correctly position infant at breast.  LATCH Score: 8   Lactation Tools Discussed/Used Tools: Pump;Flanges;Coconut oil Flange Size: 18 Breast pump type: Manual Pump Education: Setup, frequency, and cleaning;Milk Storage Pumping frequency: PRN  Interventions Interventions: Breast feeding basics reviewed;Breast compression;Adjust position;Support pillows;Education;Coconut oil;Hand pump  Discharge Discharge Education: Engorgement and breast care;Warning signs for feeding baby Pump: Personal;Hands Free  Consult Status Consult Status: Complete Date: 10/09/23    Shannon Dines [facility] 10/09/2023, 8:39 AM

## 2023-10-09 NOTE — Patient Instructions (Signed)
 Your appointment with Outpatient Lactation is: Date:10/24/2023 Time: 2:00 PM [facility] (First Floor) [address] [place]  Check in under baby's name.  Please bring your baby hungry along with your pump and a bottle of either formula or expressed breast milk. Please also bring your pump flanges and we welcome support people! If you need lactation assistance before your appointment, please call [phone] and press 4 for lactation.    If interested in an outpatient lactation consult in office or virtually please reach out to us  at [facility] (First Floor) [address] [place] Please call [phone] and press 4 for lactation.    Lactation support groups:  [facility], Tuesdays 10:00 am -12:00 pm at [address] on the second floor in the conference room, lactating parents and lap babies welcome.  [email or web address]  [email or web address]   Geraldina Louder, [facility] for [facility]

## 2023-10-11 ENCOUNTER — Encounter: Admitting: Obstetrics & Gynecology

## 2023-10-23 ENCOUNTER — Telehealth (HOSPITAL_COMMUNITY): Payer: Self-pay | Admitting: *Deleted

## 2023-10-23 NOTE — Telephone Encounter (Signed)
 10/23/2023  Name: Dana Castro MRN: [id number] DOB: 04-14-1995  Reason for Call:  Transition of Care Hospital Discharge Call  Contact Status: Patient Contact Status: Complete  Language assistant needed: Interpreter Mode: Interpreter Not Needed        Follow-Up Questions: Do You Have Any Concerns About Your Health As You Heal From Delivery?: No Do You Have Any Concerns About Your Infants Health?: No  Edinburgh Postnatal Depression Scale:  In the Past 7 Days: I have been able to laugh and see the funny side of things.: As much as I always could I have looked forward with enjoyment to things.: As much as I ever did I have blamed myself unnecessarily when things went wrong.: Not very often I have been anxious or worried for no good reason.: Yes, sometimes I have felt scared or panicky for no good reason.: No, not at all Things have been getting on top of me.: No, I have been coping as well as ever I have been so unhappy that I have had difficulty sleeping.: Not at all I have felt sad or miserable.: No, not at all I have been so unhappy that I have been crying.: Only occasionally The thought of harming myself has occurred to me.: Never Edinburgh Postnatal Depression Scale Total: 4  PHQ2-9 Depression Scale:     Discharge Follow-up: Edinburgh score requires follow up?: No Patient was advised of the following resources:: Support Group, Breastfeeding Support Group  Post-discharge interventions: Reviewed Newborn Safe Sleep Practices  Mliss Sieve, RN 10/23/2023 12:50

## 2023-11-16 ENCOUNTER — Other Ambulatory Visit: Payer: Self-pay

## 2023-11-16 DIAGNOSIS — O24410 Gestational diabetes mellitus in pregnancy, diet controlled: Secondary | ICD-10-CM

## 2023-11-23 ENCOUNTER — Other Ambulatory Visit: Payer: Self-pay

## 2023-11-23 ENCOUNTER — Encounter: Payer: Self-pay | Admitting: Obstetrics and Gynecology

## 2023-11-23 ENCOUNTER — Other Ambulatory Visit

## 2023-11-23 ENCOUNTER — Ambulatory Visit: Admitting: Obstetrics and Gynecology

## 2023-11-23 DIAGNOSIS — O24410 Gestational diabetes mellitus in pregnancy, diet controlled: Secondary | ICD-10-CM

## 2023-11-23 DIAGNOSIS — Z8632 Personal history of gestational diabetes: Secondary | ICD-10-CM | POA: Diagnosis not present

## 2023-11-23 NOTE — Progress Notes (Signed)
 Post Partum Visit Note  Dana Castro is a 28 y.o. G10P1011 female who presents for a postpartum visit. She is 6 weeks postpartum following a normal spontaneous vaginal delivery.  I have fully reviewed the prenatal and intrapartum course. The delivery was at 40 gestational weeks.  Anesthesia: epidural. Postpartum course has been good. Baby is doing well. Baby is feeding by both breast and bottle - similac 360 total care . Bleeding no bleeding. Bowel function is normal. Bladder function is normal. Patient is not sexually active. Contraception method is none. Postpartum depression screening: negative.   The pregnancy intention screening data noted above was reviewed. Potential methods of contraception were discussed. The patient elected to proceed with No data recorded.   Edinburgh Postnatal Depression Scale - 11/23/23 0859       Edinburgh Postnatal Depression Scale:  In the Past 7 Days   I have been able to laugh and see the funny side of things. 0    I have looked forward with enjoyment to things. 0    I have blamed myself unnecessarily when things went wrong. 0    I have been anxious or worried for no good reason. 2    I have felt scared or panicky for no good reason. 0    Things have been getting on top of me. 0    I have been so unhappy that I have had difficulty sleeping. 0    I have felt sad or miserable. 0    I have been so unhappy that I have been crying. 0    The thought of harming myself has occurred to me. 0    Edinburgh Postnatal Depression Scale Total 2          Health Maintenance Due  Topic Date Due   Pneumococcal Vaccine (1 of 2 - PCV) Never done   Influenza Vaccine  10/06/2023   COVID-19 Vaccine (1 - 2024-25 season) Never done    The following portions of the patient's history were reviewed and updated as appropriate: allergies, current medications, past family history, past medical history, past social history, past surgical history, and problem  list.  Review of Systems Pertinent items are noted in HPI.  Objective:  BP 110/72   Pulse 87   Wt 145 lb 8 oz (66 kg)   LMP 01/01/2023   Breastfeeding Yes   BMI 28.42 kg/m    General:  alert and cooperative   Breasts:  not indicated  Lungs: Normal effort     Abdomen: nondistended      GU exam:  not indicated       Assessment:   1. Postpartum exam (Primary)   2. History of gestational diabetes Completed gtt today   Plan:   Essential components of care per ACOG recommendations:  1.  Mood and well being: Patient with negative depression screening today. Reviewed local resources for support.  - Patient tobacco use? No.   - hx of drug use? No.    2. Infant care and feeding:  -Patient currently breastmilk feeding? Yes. Reviewed importance of draining breast regularly to support lactation.  -Social determinants of health (SDOH) reviewed in EPIC. No concerns 3. Sexuality, contraception and birth spacing - Patient does not want a pregnancy in the next year.   - Reviewed reproductive life planning. Reviewed contraceptive methods based on pt preferences and effectiveness.  Patient desired Female Condom today.   - Discussed birth spacing of 18 months  4. Sleep and fatigue -  Encouraged family/partner/community support of 4 hrs of uninterrupted sleep to help with mood and fatigue  5. Physical Recovery  - Discussed patients delivery and complications. She describes her labor as good. - Patient had a Vaginal, no problems at delivery. Patient had a none laceration. Perineal healing reviewed. Patient expressed understanding - Patient has urinary incontinence? No. - Patient is safe to resume physical and sexual activity  6.  Health Maintenance - HM due items addressed Yes - Last pap smear  Diagnosis  Date Value Ref Range Status  05/09/2023   Final   - Negative for intraepithelial lesion or malignancy (NILM)   Pap smear not done at today's visit.  -Breast Cancer screening  indicated? No.   7. Chronic Disease/Pregnancy Condition follow up: Gestational Diabetes  - PCP follow up  Nidia Daring, FNP Center for [facility], [facility]

## 2023-11-24 ENCOUNTER — Ambulatory Visit: Payer: Self-pay | Admitting: Obstetrics and Gynecology

## 2023-11-24 LAB — GLUCOSE TOLERANCE, 2 HOURS
Glucose, 2 hour: 98 mg/dL (ref 70–139)
Glucose, GTT - Fasting: 75 mg/dL (ref 70–99)

## 2023-12-16 ENCOUNTER — Encounter (HOSPITAL_COMMUNITY): Payer: Self-pay

## 2023-12-16 ENCOUNTER — Ambulatory Visit (HOSPITAL_COMMUNITY)
Admission: EM | Admit: 2023-12-16 | Discharge: 2023-12-16 | Disposition: A | Discharge status: Home / Self Care | Attending: Family Medicine | Admitting: Family Medicine

## 2023-12-16 ENCOUNTER — Ambulatory Visit (INDEPENDENT_AMBULATORY_CARE_PROVIDER_SITE_OTHER)

## 2023-12-16 DIAGNOSIS — M25571 Pain in right ankle and joints of right foot: Secondary | ICD-10-CM

## 2023-12-16 DIAGNOSIS — M79671 Pain in right foot: Secondary | ICD-10-CM

## 2023-12-16 DIAGNOSIS — S93401A Sprain of unspecified ligament of right ankle, initial encounter: Secondary | ICD-10-CM | POA: Diagnosis not present

## 2023-12-16 DIAGNOSIS — S93601A Unspecified sprain of right foot, initial encounter: Secondary | ICD-10-CM | POA: Diagnosis not present

## 2023-12-16 MED ORDER — IBUPROFEN 600 MG PO TABS: 600.0000 mg | ORAL_TABLET | Freq: Three times a day (TID) | ORAL | 0 refills | Status: AC | PRN

## 2023-12-16 NOTE — Discharge Instructions (Signed)
 There may be a fracture of one of the foot bones on the side of your foot.  The radiologist will also read your x-ray, and if their interpretation differs significantly from mine, and the management of your condition would change, we will call you.  Take ibuprofen  600 mg--1 tab every 8 hours as needed for pain.

## 2023-12-16 NOTE — ED Triage Notes (Signed)
Pt states that she fell and injured her right ankle.  X1 day

## 2023-12-16 NOTE — ED Provider Notes (Signed)
 [facility]    CSN: [id number] Arrival date & time: 12/16/23  1701      History   Chief Complaint Chief Complaint  Patient presents with   Ankle Injury    HPI Dana Castro is a 28 y.o. female.    Ankle Injury  Here for right ankle pain and foot pain.  Yesterday she was coming down the steps and missed a step and rolled her right ankle and fell.  She now has pain over the right lateral malleolus and onto the foot on the lateral portion of the midfoot and on the anterior ankle.  NKDA  Her last menstrual cycle October 1. 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 Authorizing Provider  ibuprofen  (ADVIL ) 600 MG tablet Take 1 tablet (600 mg total) by mouth every 8 (eight) hours as needed (pain). 12/16/23  Yes Arella Blinder, Sharlet POUR, MD  Prenatal Vit-Fe Fumarate-FA (MULTIVITAMIN-PRENATAL) 27-0.8 MG TABS tablet Take 1 tablet by mouth daily at 12 noon.    [provider]    Family History Family History  Problem Relation Age of Onset   Heart disease Mother        pacemaker 2024   Diabetes Father    Diabetes Paternal Grandmother    Hypertension Paternal  Grandmother    Diabetes Other     Social History Social History   Tobacco Use   Smoking status: Former    Types: Cigarettes   Smokeless tobacco: Never  Vaping Use   Vaping status: Never Used  Substance Use Topics   Alcohol use: Yes    Comment: occasionally   Drug use: Not Currently    Types: Marijuana     Allergies   Patient has no known allergies.   Review of Systems Review of Systems   Physical Exam Triage Vital Signs ED Triage Vitals  Encounter Vitals Group     BP 12/16/23 1723 (!) 108/55     Girls Systolic BP Percentile --      Girls Diastolic BP Percentile --      Boys Systolic BP Percentile --      Boys Diastolic BP Percentile --      Pulse Rate 12/16/23 1723 (!) 110     Resp 12/16/23 1723 19     Temp 12/16/23 1723 98.6 F (37  C)     Temp Source 12/16/23 1723 Oral     SpO2 12/16/23 1723 96 %     Weight 12/16/23 1721 147 lb (66.7 kg)     Height 12/16/23 1721 5' (1.524 m)     Head Circumference --      Peak Flow --      Pain Score 12/16/23 1721 10     Pain Loc --      Pain Education --      Exclude from Growth Chart --    No data found.  Updated Vital Signs BP (!) 108/55 (BP Location: Right Arm)   Pulse (!) 110   Temp 98.6 F (37 C) (Oral)   Resp 19   Ht 5' (1.524 m)   Wt 66.7 kg   LMP 12/06/2023   SpO2 96%   Breastfeeding Yes   BMI 28.71 kg/m   Visual Acuity Right Eye Distance:   Left Eye Distance:   Bilateral Distance:    Right Eye Near:   Left Eye Near:    Bilateral Near:     Physical Exam Vitals reviewed.  Constitutional:      General: She is not in acute distress.    Appearance: She is not toxic-appearing.  Musculoskeletal:     Comments: There is some tenderness and ecchymosis over the right lateral midfoot and lower ankle.  There is also some over the lateral malleolus and also anteriorly over the anterior ankle. Pulses are 2+ and equal and sensation is intact  Skin:    Coloration: Skin is not pale.  Neurological:      Mental Status: She is alert and oriented to person, place, and time.  Psychiatric:        Behavior: Behavior normal.      UC Treatments / Results  Labs (all labs ordered are listed, but only abnormal results are displayed) Labs Reviewed - No data to display  EKG   Radiology No results found.  Procedures Procedures (including critical care time)  Medications Ordered in UC Medications - No data to display  Initial Impression / Assessment and Plan / UC Course  I have reviewed the triage vital signs and the nursing notes.  Pertinent labs & imaging results that were available during my care of the patient were reviewed by me and considered in my medical decision making (see chart for details).     There is possibly a small avulsion fracture off the cuboid on her foot x-ray.  Ibuprofen  is sent in for pain relief  Crutches and boot are supplied and she is given contact information for podiatry. Final Clinical Impressions(s) / UC Diagnoses   Final diagnoses:  Acute right ankle pain  Right foot pain     Discharge Instructions      There may be a fracture of one of the foot bones on the side of your foot.  The radiologist will also read your x-ray, and if their interpretation differs significantly from mine, and the management of your condition would change, we will call you.  Take ibuprofen  600 mg--1 tab every 8 hours as needed for pain.       ED Prescriptions     Medication Sig Dispense Auth. Provider   ibuprofen  (ADVIL ) 600 MG tablet Take 1 tablet (600 mg total) by mouth every 8 (eight) hours as needed (pain). 15 tablet Linh Hedberg K, MD      PDMP not reviewed this encounter.   Vonna Sharlet POUR, MD 12/16/23 [phone]
